# Patient Record
Sex: Female | Born: 2000 | Hispanic: Yes | Marital: Single | Smoking: Never smoker
Health system: Southern US, Community
[De-identification: ages and names within clinical notes are randomized; demographics above are authoritative.]

## PROBLEM LIST (undated history)

## (undated) ENCOUNTER — Emergency Department (HOSPITAL_COMMUNITY): Payer: Self-pay | Source: Home / Self Care

## (undated) DIAGNOSIS — Z789 Other specified health status: Secondary | ICD-10-CM

## (undated) HISTORY — DX: Other specified health status: Z78.9

## (undated) HISTORY — PX: WISDOM TOOTH EXTRACTION: SHX21

---
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
## 2020-06-06 ENCOUNTER — Encounter (HOSPITAL_COMMUNITY): Payer: Self-pay | Admitting: Emergency Medicine

## 2020-06-06 ENCOUNTER — Emergency Department (HOSPITAL_COMMUNITY): Payer: Medicaid Other

## 2020-06-06 ENCOUNTER — Emergency Department (HOSPITAL_COMMUNITY)
Admission: EM | Admit: 2020-06-06 | Discharge: 2020-06-06 | Disposition: A | Discharge status: Home / Self Care | Payer: Medicaid Other | Attending: Emergency Medicine | Admitting: Emergency Medicine

## 2020-06-06 ENCOUNTER — Other Ambulatory Visit: Payer: Self-pay

## 2020-06-06 DIAGNOSIS — Z5331 Laparoscopic surgical procedure converted to open procedure: Secondary | ICD-10-CM | POA: Insufficient documentation

## 2020-06-06 DIAGNOSIS — R945 Abnormal results of liver function studies: Secondary | ICD-10-CM | POA: Diagnosis not present

## 2020-06-06 DIAGNOSIS — R109 Unspecified abdominal pain: Secondary | ICD-10-CM | POA: Diagnosis present

## 2020-06-06 DIAGNOSIS — Z8719 Personal history of other diseases of the digestive system: Secondary | ICD-10-CM | POA: Diagnosis not present

## 2020-06-06 DIAGNOSIS — K8050 Calculus of bile duct without cholangitis or cholecystitis without obstruction: Secondary | ICD-10-CM

## 2020-06-06 DIAGNOSIS — R112 Nausea with vomiting, unspecified: Secondary | ICD-10-CM | POA: Diagnosis not present

## 2020-06-06 DIAGNOSIS — R7989 Other specified abnormal findings of blood chemistry: Secondary | ICD-10-CM

## 2020-06-06 LAB — COMPREHENSIVE METABOLIC PANEL
ALT: 131 U/L — ABNORMAL HIGH (ref 0–44)
AST: 203 U/L — ABNORMAL HIGH (ref 15–41)
Albumin: 4.2 g/dL (ref 3.5–5.0)
Alkaline Phosphatase: 56 U/L (ref 38–126)
Anion gap: 9 (ref 5–15)
BUN: 9 mg/dL (ref 6–20)
CO2: 24 mmol/L (ref 22–32)
Calcium: 9.4 mg/dL (ref 8.9–10.3)
Chloride: 104 mmol/L (ref 98–111)
Creatinine, Ser: 0.66 mg/dL (ref 0.44–1.00)
GFR, Estimated: >60 mL/min (ref >60)
Glucose, Bld: 132 mg/dL — ABNORMAL HIGH (ref 70–99)
Potassium: 3.5 mmol/L (ref 3.5–5.1)
Sodium: 137 mmol/L (ref 135–145)
Total Bilirubin: 1 mg/dL (ref 0.3–1.2)
Total Protein: 7.6 g/dL (ref 6.5–8.1)

## 2020-06-06 LAB — URINALYSIS, ROUTINE W REFLEX MICROSCOPIC
Bilirubin Urine: NEGATIVE
Glucose, UA: NEGATIVE mg/dL
Hgb urine dipstick: NEGATIVE
Ketones, ur: NEGATIVE mg/dL
Nitrite: NEGATIVE
Protein, ur: 30 mg/dL — AB
Specific Gravity, Urine: 1.028 (ref 1.005–1.030)
pH: 6 (ref 5.0–8.0)

## 2020-06-06 LAB — CBC
HCT: 39.6 % (ref 36.0–46.0)
Hemoglobin: 13 g/dL (ref 12.0–15.0)
MCH: 30.4 pg (ref 26.0–34.0)
MCHC: 32.8 g/dL (ref 30.0–36.0)
MCV: 92.5 fL (ref 80.0–100.0)
Platelets: 276 10*3/uL (ref 150–400)
RBC: 4.28 MIL/uL (ref 3.87–5.11)
RDW: 11.8 % (ref 11.5–15.5)
WBC: 14.8 10*3/uL — ABNORMAL HIGH (ref 4.0–10.5)
nRBC: 0 % (ref 0.0–0.2)

## 2020-06-06 LAB — LIPASE, BLOOD: Lipase: 37 U/L (ref 11–51)

## 2020-06-06 LAB — I-STAT BETA HCG BLOOD, ED (MC, WL, AP ONLY): I-stat hCG, quantitative: <5 m[IU]/mL (ref <5)

## 2020-06-06 MED ORDER — FENTANYL CITRATE (PF) 100 MCG/2ML IJ SOLN
50.0000 ug | Freq: Once | INTRAMUSCULAR | Status: DC
  Filled 2020-06-06: qty 2

## 2020-06-06 MED ORDER — HYDROCODONE-ACETAMINOPHEN 5-325 MG PO TABS: 2.0000 | ORAL_TABLET | ORAL | 0 refills | Status: DC | PRN

## 2020-06-06 MED ORDER — ONDANSETRON 4 MG PO TBDP: 4.0000 mg | ORAL_TABLET | Freq: Three times a day (TID) | ORAL | 0 refills | Status: DC | PRN

## 2020-06-06 NOTE — ED Provider Notes (Signed)
Physical Exam  BP 108/74 (BP Location: Right Arm)   Pulse 66   Temp 98.6 F (37 C) (Oral)   Resp 15   SpO2 100%   Physical Exam Vitals and nursing note reviewed.  Constitutional:      General: She is not in acute distress.    Appearance: She is well-developed and well-nourished. She is not diaphoretic.  HENT:     Head: Normocephalic and atraumatic.  Eyes:     General: No scleral icterus.    Extraocular Movements: EOM normal.     Conjunctiva/sclera: Conjunctivae normal.  Pulmonary:     Effort: Pulmonary effort is normal. No respiratory distress.  Abdominal:     Tenderness: There is no abdominal tenderness.     Comments: Soft, nontender nondistended.  Musculoskeletal:     Cervical back: Normal range of motion.  Skin:    Findings: No rash.  Neurological:     Mental Status: She is alert.  Psychiatric:        Mood and Affect: Mood and affect normal.     ED Course/Procedures     Procedures  MDM   Care of patient assumed from PA Caccavale at 3:30 PM.  Agree with history, physical exam and plan.  See their note for further details.  Briefly, 20 y.o. female with PMH/PSH as below who presents with right upper quadrant pain. Reports symptoms began at 2 AM this morning. Reports history of similar symptoms in the past. Lab work significant for elevation in LFTs to the 100s.  Past Medical History:  Diagnosis Date  . Medical history non-contributory    Past Surgical History:  Procedure Laterality Date  . LAPAROSCOPIC APPENDECTOMY N/A 01/26/2019   Procedure: APPENDECTOMY LAPAROSCOPIC;  Surgeon: Harriette Bouillon, MD;  Location: [facility] OR;  Service: General;  Laterality: N/A;      Current Plan: Obtain RUQ U/S to rule out cholecystitis. Will dispo accordingly.   MDM/ED Course: 4:14 PM Ultrasound with cholelithiasis without evidence of obstruction or cholecystitis.  Patient with improvement in pain and on repeat abdominal exam, abdomen is nontender. She is able to keep down  fluids. Plan to treat symptomatically at home. Will consult general surgery for outpatient follow-up  4:32 PM Spoke to Dr. Andrey Campanile, on-call general surgeon.  Reviewed patient's lab work and imaging findings.  Feels that she can follow-up outpatient in the clinic with symptomatic treatment at home.  Consults: General surgery- Dr. Andrey Campanile   Significant labs/images: US Abdomen Limited RUQ (LIVER/GB)  Result Date: 06/06/2020 CLINICAL DATA:  Elevated liver function tests, abdominal pain since 2 a.m. EXAM: ULTRASOUND ABDOMEN LIMITED RIGHT UPPER QUADRANT COMPARISON:  MRI abdomen 01/26/2019 FINDINGS: Gallbladder: Calcified gallstones within the gallbladder lumen measuring up to 0.5 cm. No pericholecystic fluid or gallbladder wall thickening visualized. No sonographic Murphy sign noted by sonographer. Common bile duct: Diameter: 3 mm Liver: No focal lesion identified. Within normal limits in parenchymal echogenicity. Portal vein is patent on color Doppler imaging with normal direction of blood flow towards the liver. Other: Visualized portions of the right kidney demonstrates no hydronephrosis. IMPRESSION: Cholelithiasis with no acute cholecystitis or choledocholithiasis. Electronically Signed   By: Tish Frederickson M.D.   On: 06/06/2020 15:29    I personally reviewed and interpreted all labs.  The plan for this patient was discussed with Dr. Hyacinth Meeker, who voiced agreement and who oversaw evaluation and treatment of this patient.   Patient is hemodynamically stable, in NAD, and able to ambulate in the ED. Evaluation does not show pathology  that would require ongoing emergent intervention or inpatient treatment. I explained the diagnosis to the patient. Pain has been managed and has no complaints prior to discharge. Patient is comfortable with above plan and is stable for discharge at this time. All questions were answered prior to disposition. Strict return precautions for returning to the ED were discussed.  Encouraged follow up with PCP.   Prior to providing a prescription for a controlled substance, I independently reviewed the patient's recent prescription history on the [place] Controlled Substance Reporting System. The patient had no recent or regular prescriptions and was deemed appropriate for a brief, less than 3 day prescription of narcotic for acute analgesia.  An After Visit Summary was printed and given to the patient.   Portions of this note were generated with Scientist, clinical (histocompatibility and immunogenetics). Dictation errors may occur despite best attempts at proofreading.        Dietrich Pates, PA-C 06/06/20 1634    Eber Hong, MD 06/07/20 [phone]

## 2020-06-06 NOTE — Discharge Instructions (Signed)
Take the medications to help with your symptoms. Follow up at the surgery center listed below. Return to the ER if you start to experience worsening pain, fever, shortness of breath or uncontrollable vomiting.

## 2020-06-06 NOTE — ED Triage Notes (Signed)
Pt reports upper abd pain since 2am with nausea and vomiting.  States she was seen in [facility] for same approx 1 year ago.  Denies diarrhea.

## 2020-06-06 NOTE — ED Notes (Signed)
Patient transported to Ultrasound 

## 2020-06-06 NOTE — ED Provider Notes (Signed)
[facility] EMERGENCY DEPARTMENT Provider Note   CSN: [id number] Arrival date & time: 06/06/20  0354     History Chief Complaint  Patient presents with  . Abdominal Pain    Tracy Hines is a 20 y.o. female presented for evaluation nausea, vomiting, abd pain.   Patient states around 2:00 this morning she developed nausea, vomiting, abdominal pain.  She had 2 episodes of emesis.  She is not currently feeling nauseous.  She continues to have abdominal pain.  She took Tylenol and ibuprofen this morning around 3 AM without significant improvement of symptoms.  She has not taken anything else.  She reports history of similar, although this is more severe.  She had this evaluated last year, but had no imaging or ultrasounds done.  She denies fevers, chills, chest pain, shortness of breath, urinary symptoms, abnormal bowel movements.  She denies sick contacts.  She has no medical problems, takes no medications daily.  She denies tobacco, alcohol, or drug use.  HPI     Past Medical History:  Diagnosis Date  . Medical history non-contributory     Patient Active Problem List   Diagnosis Date Noted  . Appendicitis 01/26/2019    Past Surgical History:  Procedure Laterality Date  . LAPAROSCOPIC APPENDECTOMY N/A 01/26/2019   Procedure: APPENDECTOMY LAPAROSCOPIC;  Surgeon: Harriette Bouillon, MD;  Location: [facility] OR;  Service: General;  Laterality: N/A;     OB History    Gravida  2   Para      Term      Preterm      AB  1   Living        SAB      IAB      Ectopic      Multiple      Live Births              No family history on file.  Social History   Tobacco Use  . Smoking status: Never Smoker  . Smokeless tobacco: Never Used  Vaping Use  . Vaping Use: Former  Substance Use Topics  . Alcohol use: Not Currently  . Drug use: Not Currently    Types: Marijuana    Comment: 2-3 years ago    Home Medications Prior to Admission medications    Medication Sig Start Date End Date Taking? Authorizing Provider  Blood Pressure Monitoring (BLOOD PRESSURE MONITOR AUTOMAT) DEVI 1 Device by Does not apply route daily. 03/11/19   Reva Bores, MD  famotidine (PEPCID) 20 MG tablet Take 1 tablet (20 mg total) by mouth 2 (two) times daily. Patient not taking: Reported on 09/12/2019 03/11/19   Emi Holes, PA-C  ferrous sulfate (FERROUSUL) 325 (65 FE) MG tablet Take 1 tablet (325 mg total) by mouth 2 (two) times daily with a meal. Patient not taking: Reported on 09/12/2019 07/05/19   Raelyn Mora, CNM  Misc. Devices (GOJJI WEIGHT SCALE) MISC 1 Device by Does not apply route daily as needed. To weight self daily as needed at home. ICD-10 code: O09.90 06/21/19   Reva Bores, MD  Prenatal Vit-Fe Phos-FA-Omega (VITAFOL GUMMIES) 3.33-0.333-34.8 MG CHEW Chew 3 each by mouth daily. Patient not taking: Reported on 10/31/2019 03/11/19   Raelyn Mora, CNM    Allergies    Patient has no known allergies.  Review of Systems   Review of Systems  Gastrointestinal: Positive for abdominal pain, nausea (resolved) and vomiting (resolved).  All other systems reviewed and are negative.  Physical Exam Updated Vital Signs BP 108/74 (BP Location: Right Arm)   Pulse 66   Temp 98.6 F (37 C) (Oral)   Resp 15   SpO2 100%   Physical Exam Vitals and nursing note reviewed.  Constitutional:      General: She is not in acute distress.    Appearance: She is well-developed and well-nourished.     Comments: Resting in the bed in NAD  HENT:     Head: Normocephalic and atraumatic.  Eyes:     Extraocular Movements: EOM normal.     Conjunctiva/sclera: Conjunctivae normal.     Pupils: Pupils are equal, round, and reactive to light.  Cardiovascular:     Rate and Rhythm: Normal rate and regular rhythm.     Pulses: Normal pulses and intact distal pulses.  Pulmonary:     Effort: Pulmonary effort is normal. No respiratory distress.     Breath sounds:  Normal breath sounds. No wheezing.  Abdominal:     General: There is no distension.     Palpations: Abdomen is soft. There is no mass.     Tenderness: There is abdominal tenderness. There is no guarding or rebound.     Comments: Mild ttp of upper abd. no rigidity, guarding, distention.  Negative rebound.  Negative Murphy's.  Musculoskeletal:        General: Normal range of motion.     Cervical back: Normal range of motion and neck supple.  Skin:    General: Skin is warm and dry.     Capillary Refill: Capillary refill takes less than 2 seconds.  Neurological:     Mental Status: She is alert and oriented to person, place, and time.  Psychiatric:        Mood and Affect: Mood and affect normal.     ED Results / Procedures / Treatments   Labs (all labs ordered are listed, but only abnormal results are displayed) Labs Reviewed  COMPREHENSIVE METABOLIC PANEL - Abnormal; Notable for the following components:      Result Value   Glucose, Bld 132 (*)    AST 203 (*)    ALT 131 (*)    All other components within normal limits  CBC - Abnormal; Notable for the following components:   WBC 14.8 (*)    All other components within normal limits  URINALYSIS, ROUTINE W REFLEX MICROSCOPIC - Abnormal; Notable for the following components:   Color, Urine AMBER (*)    APPearance HAZY (*)    Protein, ur 30 (*)    Leukocytes,Ua MODERATE (*)    Bacteria, UA RARE (*)    All other components within normal limits  LIPASE, BLOOD  I-STAT BETA HCG BLOOD, ED ([facility], [facility], [facility] ONLY)    EKG None  Radiology No results found.  Procedures Procedures (including critical care time)  Medications Ordered in ED Medications  fentaNYL (SUBLIMAZE) injection 50 mcg (has no administration in time range)    ED Course  I have reviewed the triage vital signs and the nursing notes.  Pertinent labs & imaging results that were available during my care of the patient were reviewed by me and considered in my medical  decision making (see chart for details).    MDM Rules/Calculators/A&P                          Patient presented for evaluation nausea, vomiting, upper abdominal pain.  On exam, patient peers nontoxic.  Nausea and  vomiting has since resolved.  She continues to have some abdominal pain.  Considering location and intermittent nature of patient's pain, fever biliary cause.  Labs obtained in triage interpreted by me, shows elevated LFTs, once again pointing to a potential early or a cause.  Also consider viral illness.  Will treat symptomatically and obtain ultrasound.  As patient is well-appearing with a negative Murphy's and no fever, if imaging is reassuring she can likely be discharged.  Urine shows some signs of infection, however as patient is asymptomatic, I do not feel she needs treatment for asymptomatic bacteriuria.  Patient signed out to Malachi Carl, PA-C for f/u on [place] and reassessment of sxs.   Final Clinical Impression(s) / ED Diagnoses Final diagnoses:  Elevated LFTs    Rx / DC Orders ED Discharge Orders    None       Alveria Apley, PA-C 06/06/20 1513    Gwyneth Sprout, MD 06/06/20 1528

## 2020-06-06 NOTE — ED Notes (Signed)
Pt refused pain medication, denies any pain at this time. Pt states, "I usually have pain at night"

## 2020-08-17 ENCOUNTER — Ambulatory Visit: Payer: Self-pay | Admitting: Surgery

## 2020-08-17 NOTE — H&P (Signed)
Tracy Hines Appointment: 08/17/2020 3:00 PM Location: [facility] Patient #: [id number] DOB: 2000-09-29 Single / Language: [name] / Race: Refused to Report/Unreported Female  History of Present Illness Tracy Hines A. Tracy Lucchese MD; 08/17/2020 3:34 PM) Patient words: Patient presents for evaluation of gallstones. She's had about 10 attacks of right upper quadrant pain after eating. She seen back in January and emergency room and diagnosed with gallstones. She describes the pain is intermittent. Locations right upper quadrant of abdomen with radiation to her back. Pain is sharp in nature lasting minutes to hours made worse with eating especially fatty greasy food. Ultrasound showed gallstones with a 3 mm common duct. She did have elevation in her aspartate aminotransferase and ALT of time back in January but her bilirubin and alkaline phosphatase are within normal limits.           Elevated liver function tests, abdominal pain since 2 Tracym.  EXAM: ULTRASOUND ABDOMEN LIMITED RIGHT UPPER QUADRANT  COMPARISON: MRI abdomen 01/26/2019  FINDINGS: Gallbladder:  Calcified gallstones within the gallbladder lumen measuring up to 0.5 cm. No pericholecystic fluid or gallbladder wall thickening visualized. No sonographic Murphy sign noted by sonographer.  Common bile duct:  Diameter: 3 mm  Liver:  No focal lesion identified. Within normal limits in parenchymal echogenicity. Portal vein is patent on color Doppler imaging with normal direction of blood flow towards the liver.  Other: Visualized portions of the right kidney demonstrates no hydronephrosis.  IMPRESSION: Cholelithiasis with no acute cholecystitis or choledocholithiasis.   Electronically Signed By: Tish Frederickson M.D. On: 06/06/2020 15:29.  The patient is a 20 year old female.   Past Surgical History Tracy Hines, CMA; 08/17/2020 3:14 PM) Appendectomy Foot Surgery Left. Oral  Surgery  Diagnostic Studies History Tracy Hines, CMA; 08/17/2020 3:14 PM) Colonoscopy never Mammogram never Pap Smear never  Allergies (Tracy Hines, CMA; 08/17/2020 3:15 PM) No Known Drug Allergies [08/17/2020]: No Known Allergies [08/17/2020]: Allergies Reconciled  Medication History (Tracy Hines, CMA; 08/17/2020 3:16 PM) HYDROcodone-Acetaminophen (5-325MG  Tablet, Oral) Active. Ondansetron (4MG  Tablet Disint, Oral) Active. Medications Reconciled  Social History , CMA; 08/17/2020 3:14 PM) Alcohol use Occasional alcohol use. Caffeine use Coffee, Tea. No drug use Tobacco use Never smoker.  Family History 08/19/2020, CMA; 08/17/2020 3:14 PM) First Degree Relatives No pertinent family history  Pregnancy / Birth History 08/19/2020, CMA; 08/17/2020 3:14 PM) Age at menarche 10 years. Gravida 2 Irregular periods Maternal age 41-20 Para 1  Other Problems 19-38, CMA; 08/17/2020 3:14 PM) No pertinent past medical history     Review of Systems 08/19/2020 Hines CMA; 08/17/2020 3:14 PM) General Present- Fatigue. Not Present- Appetite Loss, Chills, Fever, Night Sweats, Weight Gain and Weight Loss. Skin Not Present- Change in Wart/Mole, Dryness, Hives, Jaundice, New Lesions, Non-Healing Wounds, Rash and Ulcer. HEENT Not Present- Earache, Hearing Loss, Hoarseness, Nose Bleed, Oral Ulcers, Ringing in the Ears, Seasonal Allergies, Sinus Pain, Sore Throat, Visual Disturbances, Wears glasses/contact lenses and Yellow Eyes. Respiratory Not Present- Bloody sputum, Chronic Cough, Difficulty Breathing, Snoring and Wheezing. Breast Not Present- Breast Mass, Breast Pain, Nipple Discharge and Skin Changes. Cardiovascular Not Present- Chest Pain, Difficulty Breathing Lying Down, Leg Cramps, Palpitations, Rapid Heart Rate, Shortness of Breath and Swelling of  Extremities. Gastrointestinal Present- Nausea and Vomiting. Not Present- Abdominal Pain, Bloating, Bloody Stool, Change in Bowel Habits, Chronic diarrhea, Constipation, Difficulty Swallowing, Excessive gas, Gets full quickly at meals, Hemorrhoids, Indigestion and Rectal Pain. Female Genitourinary Not Present- Frequency, Nocturia, Painful Urination, Pelvic Pain and Urgency. Musculoskeletal  Present- Back Pain. Not Present- Joint Pain, Joint Stiffness, Muscle Pain, Muscle Weakness and Swelling of Extremities. Neurological Not Present- Decreased Memory, Fainting, Headaches, Numbness, Seizures, Tingling, Tremor, Trouble walking and Weakness. Psychiatric Not Present- Anxiety, Bipolar, Change in Sleep Pattern, Depression, Fearful and Frequent crying. Endocrine Not Present- Cold Intolerance, Excessive Hunger, Hair Changes, Heat Intolerance, Hot flashes and New Diabetes. Hematology Not Present- Blood Thinners, Easy Bruising, Excessive bleeding, Gland problems, HIV and Persistent Infections.  Vitals (Tracy Hines CMA; 08/17/2020 3:17 PM) 08/17/2020 3:16 PM Weight: 138.13 lb (67th percentile) Height: 61in (10th percentile) Body Surface Area: 1.61 m Body Mass Index: 26.1 kg/m  (84th percentile)  Temp.: 97.31F  Pulse: 96 (Regular)  P.OX: 99% (Room air) BP: 114/68(Sitting, Left Arm, Standard)  Percentiles calculated using CDC data for children 2-20 years.      Physical Exam (Tracy Hines [name] MD; 08/17/2020 3:34 PM)  General Mental Status-Alert. General Appearance-Consistent with stated age. Hydration-Well hydrated. Voice-Normal.  Head and Neck Head-normocephalic, atraumatic with no lesions or palpable masses.  Eye Eyeball - Bilateral-Extraocular movements intact. Sclera/Conjunctiva - Bilateral-No scleral icterus.  Chest and Lung Exam Chest and lung exam reveals -quiet, even and easy respiratory effort with no use of accessory muscles and on  auscultation, normal breath sounds, no adventitious sounds and normal vocal resonance. Inspection Chest Wall - Normal. Back - normal.  Cardiovascular Cardiovascular examination reveals -on palpation PMI is normal in location and amplitude, no palpable S3 or S4. Normal cardiac borders., normal heart sounds, regular rate and rhythm with no murmurs, carotid auscultation reveals no bruits and normal pedal pulses bilaterally.  Abdomen Inspection Inspection of the abdomen reveals - No Hernias. Skin - Scar - no surgical scars. Palpation/Percussion Palpation and Percussion of the abdomen reveal - Soft, Non Tender, No Rebound tenderness, No Rigidity (guarding) and No hepatosplenomegaly. Auscultation Auscultation of the abdomen reveals - Bowel sounds normal.  Neurologic Neurologic evaluation reveals -alert and oriented x 3 with no impairment of recent or remote memory. Mental Status-Normal.  Musculoskeletal Normal Exam - Left-Upper Extremity Strength Normal and Lower Extremity Strength Normal. Normal Exam - Right-Upper Extremity Strength Normal, Lower Extremity Weakness.    Assessment & Plan (Philemon Riedesel A. Jazma Pickel MD; 08/17/2020 3:31 PM)  SYMPTOMATIC CHOLELITHIASIS (K80.20)  Current Plans You are being scheduled for surgery- Our schedulers will call you.  You should hear from our office's scheduling department within 5 working days about the location, date, and time of surgery. We try to make accommodations for patient's preferences in scheduling surgery, but sometimes the OR schedule or the surgeon's schedule prevents [place] from making those accommodations.  If you have not heard from our office [phone]) in 5 working days, call the office and ask for your surgeon's nurse.  If you have other questions about your diagnosis, plan, or surgery, call the office and ask for your surgeon's nurse.  Pt Education - Pamphlet Given - Laparoscopic Gallbladder Surgery: discussed with patient  and provided information. Pt Education - Laparoscopic Cholecystectomy: gallbladder Pt Education - [facility]CS Laparosopic Post Op HCI (Gross) The anatomy & physiology of hepatobiliary & pancreatic function was discussed. The pathophysiology of gallbladder dysfunction was discussed. Natural history risks without surgery was discussed. I feel the risks of no intervention will lead to serious problems that outweigh the operative risks; therefore, I recommended cholecystectomy to remove the pathology. I explained laparoscopic techniques with possible need for an open approach. Probable cholangiogram to evaluate the bilary tract was explained as well.  Risks such as bleeding, infection, abscess, leak,  injury to other organs, need for further treatment, heart attack, death, and other risks were discussed. I noted a good likelihood this will help address the problem. Possibility that this will not correct all abdominal symptoms was explained. Goals of post-operative recovery were discussed as well. We will work to minimize complications. An educational handout further explaining the pathology and treatment options was given as well. Questions were answered. The patient expresses understanding & wishes to proceed with surgery.

## 2020-09-18 ENCOUNTER — Other Ambulatory Visit (HOSPITAL_COMMUNITY)
Admission: RE | Admit: 2020-09-18 | Discharge: 2020-09-18 | Disposition: A | Discharge status: Home / Self Care | Payer: Medicaid Other | Source: Ambulatory Visit | Attending: Surgery | Admitting: Surgery

## 2020-09-18 DIAGNOSIS — Z20822 Contact with and (suspected) exposure to covid-19: Secondary | ICD-10-CM | POA: Insufficient documentation

## 2020-09-18 DIAGNOSIS — Z01812 Encounter for preprocedural laboratory examination: Secondary | ICD-10-CM | POA: Diagnosis present

## 2020-09-18 LAB — SARS CORONAVIRUS 2 (TAT 6-24 HRS): SARS Coronavirus 2: NEGATIVE

## 2020-09-21 ENCOUNTER — Encounter (HOSPITAL_COMMUNITY): Payer: Self-pay | Admitting: Surgery

## 2020-09-21 ENCOUNTER — Other Ambulatory Visit: Payer: Self-pay

## 2020-09-21 NOTE — Progress Notes (Signed)
Patient denies shortness of breath, fever, cough or chest pain.  PCP - None Cardiologist - n/a  Chest x-ray - n/a EKG - n/a Stress Test - n/a ECHO - n/a Cardiac Cath - n/a  STOP now taking any Aspirin (unless otherwise instructed by your surgeon), Aleve, Naproxen, Ibuprofen, Motrin, Advil, Goody's, BC's, all herbal medications, fish oil, and all vitamins.   Coronavirus Screening Covid test on 09/18/20 was negative.  Patient verbalized understanding of instructions that were given via phone.

## 2020-09-21 NOTE — Anesthesia Preprocedure Evaluation (Addendum)
Anesthesia Evaluation  Patient identified by MRN, date of birth, ID band Patient awake    Reviewed: Allergy & Precautions, NPO status , Patient's Chart, lab work & pertinent test results  Airway Mallampati: II  TM Distance: >3 FB Neck ROM: Full    Dental no notable dental hx.    Pulmonary neg pulmonary ROS, Patient abstained from smoking.,    Pulmonary exam normal breath sounds clear to auscultation       Cardiovascular negative cardio ROS Normal cardiovascular exam Rhythm:Regular Rate:Normal     Neuro/Psych negative neurological ROS  negative psych ROS   GI/Hepatic Neg liver ROS, Gallstones   Endo/Other  negative endocrine ROS  Renal/GU negative Renal ROS  negative genitourinary   Musculoskeletal negative musculoskeletal ROS (+)   Abdominal   Peds negative pediatric ROS (+)  Hematology negative hematology ROS (+)   Anesthesia Other Findings   Reproductive/Obstetrics negative OB ROS                            Anesthesia Physical Anesthesia Plan  ASA: I  Anesthesia Plan: General   Post-op Pain Management:    Induction: Intravenous  PONV Risk Score and Plan: 3 and Scopolamine patch - Pre-op, Ondansetron, Dexamethasone and Treatment may vary due to age or medical condition  Airway Management Planned: Oral ETT  Additional Equipment: None  Intra-op Plan:   Post-operative Plan: Extubation in OR  Informed Consent: I have reviewed the patients History and Physical, chart, labs and discussed the procedure including the risks, benefits and alternatives for the proposed anesthesia with the patient or authorized representative who has indicated his/her understanding and acceptance.     Dental advisory given  Plan Discussed with: CRNA, Anesthesiologist and Surgeon  Anesthesia Plan Comments:        Anesthesia Quick Evaluation

## 2020-09-22 ENCOUNTER — Encounter (HOSPITAL_COMMUNITY)
Admission: RE | Disposition: A | Discharge status: Home / Self Care | Payer: Self-pay | Source: Home / Self Care | Attending: Surgery

## 2020-09-22 ENCOUNTER — Ambulatory Visit (HOSPITAL_COMMUNITY): Payer: Medicaid Other

## 2020-09-22 ENCOUNTER — Encounter (HOSPITAL_COMMUNITY): Payer: Self-pay | Admitting: Surgery

## 2020-09-22 ENCOUNTER — Ambulatory Visit (HOSPITAL_COMMUNITY): Payer: Medicaid Other | Admitting: Anesthesiology

## 2020-09-22 ENCOUNTER — Other Ambulatory Visit: Payer: Self-pay

## 2020-09-22 ENCOUNTER — Ambulatory Visit (HOSPITAL_COMMUNITY)
Admission: RE | Admit: 2020-09-22 | Discharge: 2020-09-22 | Disposition: A | Discharge status: Home / Self Care | Payer: Medicaid Other | Attending: Surgery | Admitting: Surgery

## 2020-09-22 DIAGNOSIS — K8010 Calculus of gallbladder with chronic cholecystitis without obstruction: Secondary | ICD-10-CM | POA: Diagnosis not present

## 2020-09-22 DIAGNOSIS — Z79899 Other long term (current) drug therapy: Secondary | ICD-10-CM | POA: Diagnosis not present

## 2020-09-22 DIAGNOSIS — Z419 Encounter for procedure for purposes other than remedying health state, unspecified: Secondary | ICD-10-CM

## 2020-09-22 DIAGNOSIS — Z9049 Acquired absence of other specified parts of digestive tract: Secondary | ICD-10-CM | POA: Insufficient documentation

## 2020-09-22 DIAGNOSIS — K8020 Calculus of gallbladder without cholecystitis without obstruction: Secondary | ICD-10-CM | POA: Diagnosis present

## 2020-09-22 HISTORY — PX: CHOLECYSTECTOMY: SHX55

## 2020-09-22 LAB — COMPREHENSIVE METABOLIC PANEL
ALT: 20 U/L (ref 0–44)
AST: 32 U/L (ref 15–41)
Albumin: 3.5 g/dL (ref 3.5–5.0)
Alkaline Phosphatase: 46 U/L (ref 38–126)
Anion gap: 7 (ref 5–15)
BUN: 8 mg/dL (ref 6–20)
CO2: 25 mmol/L (ref 22–32)
Calcium: 8.9 mg/dL (ref 8.9–10.3)
Chloride: 106 mmol/L (ref 98–111)
Creatinine, Ser: 0.69 mg/dL (ref 0.44–1.00)
GFR, Estimated: >60 mL/min (ref >60)
Glucose, Bld: 97 mg/dL (ref 70–99)
Potassium: 3.4 mmol/L — ABNORMAL LOW (ref 3.5–5.1)
Sodium: 138 mmol/L (ref 135–145)
Total Bilirubin: 0.4 mg/dL (ref 0.3–1.2)
Total Protein: 6.2 g/dL — ABNORMAL LOW (ref 6.5–8.1)

## 2020-09-22 LAB — CBC WITH DIFFERENTIAL/PLATELET
Abs Immature Granulocytes: 0.04 10*3/uL (ref 0.00–0.07)
Basophils Absolute: 0.1 10*3/uL (ref 0.0–0.1)
Basophils Relative: 1 %
Eosinophils Absolute: 0.1 10*3/uL (ref 0.0–0.5)
Eosinophils Relative: 2 %
HCT: 33.3 % — ABNORMAL LOW (ref 36.0–46.0)
Hemoglobin: 11.3 g/dL — ABNORMAL LOW (ref 12.0–15.0)
Immature Granulocytes: 1 %
Lymphocytes Relative: 35 %
Lymphs Abs: 2.2 10*3/uL (ref 0.7–4.0)
MCH: 31 pg (ref 26.0–34.0)
MCHC: 33.9 g/dL (ref 30.0–36.0)
MCV: 91.5 fL (ref 80.0–100.0)
Monocytes Absolute: 0.7 10*3/uL (ref 0.1–1.0)
Monocytes Relative: 10 %
Neutro Abs: 3.2 10*3/uL (ref 1.7–7.7)
Neutrophils Relative %: 51 %
Platelets: 199 10*3/uL (ref 150–400)
RBC: 3.64 MIL/uL — ABNORMAL LOW (ref 3.87–5.11)
RDW: 13.3 % (ref 11.5–15.5)
WBC: 6.3 10*3/uL (ref 4.0–10.5)
nRBC: 0 % (ref 0.0–0.2)

## 2020-09-22 LAB — POCT PREGNANCY, URINE: Preg Test, Ur: NEGATIVE

## 2020-09-22 SURGERY — LAPAROSCOPIC CHOLECYSTECTOMY WITH INTRAOPERATIVE CHOLANGIOGRAM
Anesthesia: General | Site: Abdomen

## 2020-09-22 MED ORDER — DROPERIDOL 2.5 MG/ML IJ SOLN: 0.6250 mg | Freq: Once | INTRAMUSCULAR | Status: DC | PRN

## 2020-09-22 MED ORDER — ORAL CARE MOUTH RINSE: 15.0000 mL | Freq: Once | OROMUCOSAL | Status: AC

## 2020-09-22 MED ORDER — PROPOFOL 10 MG/ML IV BOLUS
INTRAVENOUS | Status: AC
  Filled 2020-09-22: qty 40

## 2020-09-22 MED ORDER — SCOPOLAMINE 1 MG/3DAYS TD PT72
1.0000 | MEDICATED_PATCH | TRANSDERMAL | Status: DC
  Administered 2020-09-22: 1.5 mg via TRANSDERMAL
  Filled 2020-09-22: qty 1

## 2020-09-22 MED ORDER — ONDANSETRON HCL 4 MG/2ML IJ SOLN
INTRAMUSCULAR | Status: AC
  Filled 2020-09-22: qty 2

## 2020-09-22 MED ORDER — BUPIVACAINE-EPINEPHRINE 0.25% -1:200000 IJ SOLN
INTRAMUSCULAR | Status: DC | PRN
  Administered 2020-09-22: 10 mL

## 2020-09-22 MED ORDER — ACETAMINOPHEN 500 MG PO TABS
1000.0000 mg | ORAL_TABLET | ORAL | Status: AC
  Administered 2020-09-22: 1000 mg via ORAL
  Filled 2020-09-22: qty 2

## 2020-09-22 MED ORDER — CEFAZOLIN SODIUM-DEXTROSE 2-4 GM/100ML-% IV SOLN
2.0000 g | INTRAVENOUS | Status: AC
  Administered 2020-09-22: 2 g via INTRAVENOUS
  Filled 2020-09-22: qty 100

## 2020-09-22 MED ORDER — OXYCODONE HCL 5 MG/5ML PO SOLN: 5.0000 mg | Freq: Once | ORAL | Status: AC | PRN

## 2020-09-22 MED ORDER — DEXAMETHASONE SODIUM PHOSPHATE 10 MG/ML IJ SOLN
INTRAMUSCULAR | Status: DC | PRN
  Administered 2020-09-22: 5 mg via INTRAVENOUS

## 2020-09-22 MED ORDER — CHLORHEXIDINE GLUCONATE 0.12 % MT SOLN
15.0000 mL | Freq: Once | OROMUCOSAL | Status: AC
  Administered 2020-09-22: 15 mL via OROMUCOSAL
  Filled 2020-09-22: qty 15

## 2020-09-22 MED ORDER — FENTANYL CITRATE (PF) 100 MCG/2ML IJ SOLN
25.0000 ug | INTRAMUSCULAR | Status: DC | PRN
  Administered 2020-09-22: 50 ug via INTRAVENOUS

## 2020-09-22 MED ORDER — ONDANSETRON HCL 4 MG PO TABS: 4.0000 mg | ORAL_TABLET | Freq: Every day | ORAL | 1 refills | Status: AC | PRN

## 2020-09-22 MED ORDER — MIDAZOLAM HCL 2 MG/2ML IJ SOLN
INTRAMUSCULAR | Status: DC | PRN
  Administered 2020-09-22: 2 mg via INTRAVENOUS

## 2020-09-22 MED ORDER — HEMOSTATIC AGENTS (NO CHARGE) OPTIME
TOPICAL | Status: DC | PRN
  Administered 2020-09-22: 1 via TOPICAL

## 2020-09-22 MED ORDER — FENTANYL CITRATE (PF) 250 MCG/5ML IJ SOLN
INTRAMUSCULAR | Status: AC
  Filled 2020-09-22: qty 5

## 2020-09-22 MED ORDER — IBUPROFEN 800 MG PO TABS: 800.0000 mg | ORAL_TABLET | Freq: Three times a day (TID) | ORAL | 0 refills | Status: AC | PRN

## 2020-09-22 MED ORDER — OXYCODONE HCL 5 MG PO TABS
5.0000 mg | ORAL_TABLET | Freq: Once | ORAL | Status: AC | PRN
  Administered 2020-09-22: 5 mg via ORAL

## 2020-09-22 MED ORDER — KETOROLAC TROMETHAMINE 30 MG/ML IJ SOLN
INTRAMUSCULAR | Status: AC
  Filled 2020-09-22: qty 1

## 2020-09-22 MED ORDER — ROCURONIUM BROMIDE 10 MG/ML (PF) SYRINGE
PREFILLED_SYRINGE | INTRAVENOUS | Status: AC
  Filled 2020-09-22: qty 10

## 2020-09-22 MED ORDER — LACTATED RINGERS IV SOLN: INTRAVENOUS | Status: DC

## 2020-09-22 MED ORDER — KETOROLAC TROMETHAMINE 30 MG/ML IJ SOLN
30.0000 mg | Freq: Once | INTRAMUSCULAR | Status: AC
  Administered 2020-09-22: 30 mg via INTRAVENOUS

## 2020-09-22 MED ORDER — FENTANYL CITRATE (PF) 100 MCG/2ML IJ SOLN
INTRAMUSCULAR | Status: AC
  Filled 2020-09-22: qty 2

## 2020-09-22 MED ORDER — PROMETHAZINE HCL 25 MG/ML IJ SOLN: 6.2500 mg | INTRAMUSCULAR | Status: DC | PRN

## 2020-09-22 MED ORDER — PROPOFOL 10 MG/ML IV BOLUS
INTRAVENOUS | Status: DC | PRN
  Administered 2020-09-22 (×2): 20 mg via INTRAVENOUS
  Administered 2020-09-22: 150 mg via INTRAVENOUS

## 2020-09-22 MED ORDER — PHENYLEPHRINE 40 MCG/ML (10ML) SYRINGE FOR IV PUSH (FOR BLOOD PRESSURE SUPPORT)
PREFILLED_SYRINGE | INTRAVENOUS | Status: DC | PRN
  Administered 2020-09-22: 80 ug via INTRAVENOUS

## 2020-09-22 MED ORDER — MIDAZOLAM HCL 2 MG/2ML IJ SOLN
INTRAMUSCULAR | Status: AC
  Filled 2020-09-22: qty 2

## 2020-09-22 MED ORDER — CELECOXIB 200 MG PO CAPS
200.0000 mg | ORAL_CAPSULE | ORAL | Status: AC
  Administered 2020-09-22: 200 mg via ORAL
  Filled 2020-09-22: qty 1

## 2020-09-22 MED ORDER — CHLORHEXIDINE GLUCONATE CLOTH 2 % EX PADS: 6.0000 | MEDICATED_PAD | Freq: Once | CUTANEOUS | Status: DC

## 2020-09-22 MED ORDER — LIDOCAINE 2% (20 MG/ML) 5 ML SYRINGE
INTRAMUSCULAR | Status: DC | PRN
  Administered 2020-09-22: 40 mg via INTRAVENOUS
  Administered 2020-09-22: 60 mg via INTRAVENOUS

## 2020-09-22 MED ORDER — SUGAMMADEX SODIUM 200 MG/2ML IV SOLN
INTRAVENOUS | Status: DC | PRN
  Administered 2020-09-22: 200 mg via INTRAVENOUS

## 2020-09-22 MED ORDER — CHLORHEXIDINE GLUCONATE CLOTH 2 % EX PADS
6.0000 | MEDICATED_PAD | Freq: Once | CUTANEOUS | Status: AC
  Administered 2020-09-22: 6 via TOPICAL

## 2020-09-22 MED ORDER — DEXMEDETOMIDINE (PRECEDEX) IN NS 20 MCG/5ML (4 MCG/ML) IV SYRINGE
PREFILLED_SYRINGE | INTRAVENOUS | Status: DC | PRN
  Administered 2020-09-22: 4 ug via INTRAVENOUS
  Administered 2020-09-22: 8 ug via INTRAVENOUS

## 2020-09-22 MED ORDER — ONDANSETRON HCL 4 MG/2ML IJ SOLN
INTRAMUSCULAR | Status: DC | PRN
  Administered 2020-09-22: 4 mg via INTRAVENOUS

## 2020-09-22 MED ORDER — BUPIVACAINE-EPINEPHRINE (PF) 0.25% -1:200000 IJ SOLN
INTRAMUSCULAR | Status: AC
  Filled 2020-09-22: qty 30

## 2020-09-22 MED ORDER — LIDOCAINE 2% (20 MG/ML) 5 ML SYRINGE
INTRAMUSCULAR | Status: AC
  Filled 2020-09-22: qty 5

## 2020-09-22 MED ORDER — SODIUM CHLORIDE 0.9 % IR SOLN
Status: DC | PRN
  Administered 2020-09-22: 1000 mL

## 2020-09-22 MED ORDER — OXYCODONE HCL 5 MG PO TABS: 5.0000 mg | ORAL_TABLET | Freq: Four times a day (QID) | ORAL | 0 refills | Status: DC | PRN

## 2020-09-22 MED ORDER — DEXAMETHASONE SODIUM PHOSPHATE 10 MG/ML IJ SOLN
INTRAMUSCULAR | Status: AC
  Filled 2020-09-22: qty 1

## 2020-09-22 MED ORDER — OXYCODONE HCL 5 MG PO TABS
ORAL_TABLET | ORAL | Status: AC
  Filled 2020-09-22: qty 1

## 2020-09-22 MED ORDER — 0.9 % SODIUM CHLORIDE (POUR BTL) OPTIME
TOPICAL | Status: DC | PRN
  Administered 2020-09-22: 1000 mL

## 2020-09-22 MED ORDER — ROCURONIUM BROMIDE 10 MG/ML (PF) SYRINGE
PREFILLED_SYRINGE | INTRAVENOUS | Status: DC | PRN
  Administered 2020-09-22: 60 mg via INTRAVENOUS

## 2020-09-22 MED ORDER — FENTANYL CITRATE (PF) 250 MCG/5ML IJ SOLN
INTRAMUSCULAR | Status: DC | PRN
  Administered 2020-09-22 (×2): 50 ug via INTRAVENOUS

## 2020-09-22 SURGICAL SUPPLY — 44 items
APPLICATOR ARISTA FLEXITIP XL (MISCELLANEOUS) ×2 IMPLANT
APPLIER CLIP ROT 10 11.4 M/L (STAPLE) ×2
CANISTER SUCT 3000ML PPV (MISCELLANEOUS) ×2 IMPLANT
CHLORAPREP W/TINT 26 (MISCELLANEOUS) ×2 IMPLANT
CLIP APPLIE ROT 10 11.4 M/L (STAPLE) ×1 IMPLANT
COVER MAYO STAND STRL (DRAPES) ×2 IMPLANT
COVER SURGICAL LIGHT HANDLE (MISCELLANEOUS) ×2 IMPLANT
COVER WAND RF STERILE (DRAPES) ×2 IMPLANT
DERMABOND ADVANCED (GAUZE/BANDAGES/DRESSINGS) ×1
DERMABOND ADVANCED .7 DNX12 (GAUZE/BANDAGES/DRESSINGS) ×1 IMPLANT
DRAPE C-ARM 42X120 X-RAY (DRAPES) ×2 IMPLANT
ELECT REM PT RETURN 9FT ADLT (ELECTROSURGICAL) ×2
ELECTRODE REM PT RTRN 9FT ADLT (ELECTROSURGICAL) ×1 IMPLANT
GLOVE BIO SURGEON STRL SZ8 (GLOVE) ×2 IMPLANT
GLOVE SRG 8 PF TXTR STRL LF DI (GLOVE) ×1 IMPLANT
GLOVE SURG UNDER POLY LF SZ8 (GLOVE) ×1
GOWN STRL REUS W/ TWL LRG LVL3 (GOWN DISPOSABLE) ×2 IMPLANT
GOWN STRL REUS W/ TWL XL LVL3 (GOWN DISPOSABLE) ×1 IMPLANT
GOWN STRL REUS W/TWL LRG LVL3 (GOWN DISPOSABLE) ×2
GOWN STRL REUS W/TWL XL LVL3 (GOWN DISPOSABLE) ×1
HEMOSTAT ARISTA ABSORB 3G PWDR (HEMOSTASIS) ×2 IMPLANT
KIT BASIN OR (CUSTOM PROCEDURE TRAY) ×2 IMPLANT
KIT TURNOVER KIT B (KITS) ×2 IMPLANT
NS IRRIG 1000ML POUR BTL (IV SOLUTION) ×2 IMPLANT
PAD ARMBOARD 7.5X6 YLW CONV (MISCELLANEOUS) ×2 IMPLANT
POUCH RETRIEVAL ECOSAC 10 (ENDOMECHANICALS) ×1 IMPLANT
POUCH RETRIEVAL ECOSAC 10MM (ENDOMECHANICALS) ×1
SCISSORS LAP 5X35 DISP (ENDOMECHANICALS) ×2 IMPLANT
SET IRRIG TUBING LAPAROSCOPIC (IRRIGATION / IRRIGATOR) ×2 IMPLANT
SET TUBE SMOKE EVAC HIGH FLOW (TUBING) ×2 IMPLANT
SLEEVE ENDOPATH XCEL 5M (ENDOMECHANICALS) ×2 IMPLANT
SOL ANTI FOG 6CC (MISCELLANEOUS) ×1 IMPLANT
SOLUTION ANTI FOG 6CC (MISCELLANEOUS) ×1
SPECIMEN JAR SMALL (MISCELLANEOUS) ×2 IMPLANT
SUT MNCRL AB 4-0 PS2 18 (SUTURE) ×2 IMPLANT
SUT VICRYL 0 UR6 27IN ABS (SUTURE) ×2 IMPLANT
TOWEL GREEN STERILE (TOWEL DISPOSABLE) ×2 IMPLANT
TOWEL GREEN STERILE FF (TOWEL DISPOSABLE) ×2 IMPLANT
TRAY LAPAROSCOPIC MC (CUSTOM PROCEDURE TRAY) ×2 IMPLANT
TROCAR XCEL BLUNT TIP 100MML (ENDOMECHANICALS) ×2 IMPLANT
TROCAR XCEL NON-BLD 11X100MML (ENDOMECHANICALS) ×2 IMPLANT
TROCAR XCEL NON-BLD 5MMX100MML (ENDOMECHANICALS) ×2 IMPLANT
WARMER LAPAROSCOPE (MISCELLANEOUS) ×2 IMPLANT
WATER STERILE IRR 1000ML POUR (IV SOLUTION) ×2 IMPLANT

## 2020-09-22 NOTE — Transfer of Care (Signed)
Immediate Anesthesia Transfer of Care Note  Patient: Tracy Hines  Procedure(s) Performed: LAPAROSCOPIC CHOLECYSTECTOMY (N/A Abdomen)  Patient Location: PACU  Anesthesia Type:General  Level of Consciousness: drowsy, patient cooperative and responds to stimulation  Airway & Oxygen Therapy: Patient Spontanous Breathing  Post-op Assessment: Report given to RN, Post -op Vital signs reviewed and stable and Patient moving all extremities  Post vital signs: Reviewed and stable  Last Vitals:  Vitals Value Taken Time  BP 93/70 09/22/20 0853  Temp    Pulse 80 09/22/20 0854  Resp 20 09/22/20 0854  SpO2 94 % 09/22/20 0854  Vitals shown include unvalidated device data.  Last Pain:  Vitals:   09/22/20 0620  TempSrc:   PainSc: 0-No pain      Patients Stated Pain Goal: 1 (09/22/20 4665)  Complications: No complications documented.

## 2020-09-22 NOTE — H&P (Signed)
Tracy Hines  Location: [facility] Patient #: [id number] DOB: May 12, 2001 Single / Language: [name] / Race: Refused to Report/Unreported Female  History of Present Illness Patient words: Patient presents for evaluation of gallstones. She's had about 10 attacks of right upper quadrant pain after eating. She seen back in January and emergency room and diagnosed with gallstones. She describes the pain is intermittent. Locations right upper quadrant of abdomen with radiation to her back. Pain is sharp in nature lasting minutes to hours made worse with eating especially fatty greasy food. Ultrasound showed gallstones with a 3 mm common duct. She did have elevation in her aspartate aminotransferase and ALT of time back in January but her bilirubin and alkaline phosphatase are within normal limits.           Elevated liver function tests, abdominal pain since 2 a.m.  EXAM: ULTRASOUND ABDOMEN LIMITED RIGHT UPPER QUADRANT  COMPARISON: MRI abdomen 01/26/2019  FINDINGS: Gallbladder:  Calcified gallstones within the gallbladder lumen measuring up to 0.5 cm. No pericholecystic fluid or gallbladder wall thickening visualized. No sonographic Murphy sign noted by sonographer.  Common bile duct:  Diameter: 3 mm  Liver:  No focal lesion identified. Within normal limits in parenchymal echogenicity. Portal vein is patent on color Doppler imaging with normal direction of blood flow towards the liver.  Other: Visualized portions of the right kidney demonstrates no hydronephrosis.  IMPRESSION: Cholelithiasis with no acute cholecystitis or choledocholithiasis.   Electronically Signed By: Tish Frederickson M.D. On: 06/06/2020 15:29.  The patient is a 20 year old female.   Past Surgical History  Appendectomy Foot Surgery Left. Oral Surgery  Diagnostic Studies HistoryColonoscopy never Mammogram never Pap Smear  never  Allergies No Known Drug Allergies : Allergies Reconciled  Medication History  HYDROcodone-Acetaminophen (5-325MG  Tablet, Oral) Active. Ondansetron (4MG  Tablet Disint, Oral) Active. Medications Reconciled  Social History Alcohol use Occasional alcohol use. Caffeine use Coffee, Tea. No drug use Tobacco use Never smoker.  Family History First Degree Relatives No pertinent family history  Pregnancy / Birth History Age at menarche 10 years. Gravida 2 Irregular periods Maternal age 46-20 Para 1  Other Problems ( No pertinent past medical history     Review of Systems  General Present- Fatigue. Not Present- Appetite Loss, Chills, Fever, Night Sweats, Weight Gain and Weight Loss. Skin Not Present- Change in Wart/Mole, Dryness, Hives, Jaundice, New Lesions, Non-Healing Wounds, Rash and Ulcer. HEENT Not Present- Earache, Hearing Loss, Hoarseness, Nose Bleed, Oral Ulcers, Ringing in the Ears, Seasonal Allergies, Sinus Pain, Sore Throat, Visual Disturbances, Wears glasses/contact lenses and Yellow Eyes. Respiratory Not Present- Bloody sputum, Chronic Cough, Difficulty Breathing, Snoring and Wheezing. Breast Not Present- Breast Mass, Breast Pain, Nipple Discharge and Skin Changes. Cardiovascular Not Present- Chest Pain, Difficulty Breathing Lying Down, Leg Cramps, Palpitations, Rapid Heart Rate, Shortness of Breath and Swelling of Extremities. Gastrointestinal Present- Nausea and Vomiting. Not Present- Abdominal Pain, Bloating, Bloody Stool, Change in Bowel Habits, Chronic diarrhea, Constipation, Difficulty Swallowing, Excessive gas, Gets full quickly at meals, Hemorrhoids, Indigestion and Rectal Pain. Female Genitourinary Not Present- Frequency, Nocturia, Painful Urination, Pelvic Pain and Urgency. Musculoskeletal Present- Back Pain. Not Present- Joint Pain, Joint Stiffness, Muscle Pain, Muscle Weakness and Swelling of Extremities. Neurological Not  Present- Decreased Memory, Fainting, Headaches, Numbness, Seizures, Tingling, Tremor, Trouble walking and Weakness. Psychiatric Not Present- Anxiety, Bipolar, Change in Sleep Pattern, Depression, Fearful and Frequent crying. Endocrine Not Present- Cold Intolerance, Excessive Hunger, Hair Changes, Heat Intolerance, Hot flashes and New Diabetes. Hematology Not Present-  Blood Thinners, Easy Bruising, Excessive bleeding, Gland problems, HIV and Persistent Infections.  Vitals   Weight: 138.13 lb (67th percentile) Height: 61in (10th percentile) Body Surface Area: 1.61 m Body Mass Index: 26.1 kg/m  (84th percentile)  Temp.: 97.32F  Pulse: 96 (Regular)  P.OX: 99% (Room air) BP: 114/68(Sitting, Left Arm, Standard)  Percentiles calculated using CDC data for children 2-20 years.      Physical Exam   General Mental Status-Alert. General Appearance-Consistent with stated age. Hydration-Well hydrated. Voice-Normal.  Head and Neck Head-normocephalic, atraumatic with no lesions or palpable masses.  Eye Eyeball - Bilateral-Extraocular movements intact. Sclera/Conjunctiva - Bilateral-No scleral icterus.  Chest and Lung Exam Chest and lung exam reveals -quiet, even and easy respiratory effort with no use of accessory muscles and on auscultation, normal breath sounds, no adventitious sounds and normal vocal resonance. Inspection Chest Wall - Normal. Back - normal.  Cardiovascular Cardiovascular examination reveals -on palpation PMI is normal in location and amplitude, no palpable S3 or S4. Normal cardiac borders., normal heart sounds, regular rate and rhythm with no murmurs, carotid auscultation reveals no bruits and normal pedal pulses bilaterally.  Abdomen Inspection Inspection of the abdomen reveals - No Hernias. Skin - Scar - no surgical scars. Palpation/Percussion Palpation and Percussion of the abdomen reveal - Soft, Non Tender, No  Rebound tenderness, No Rigidity (guarding) and No hepatosplenomegaly. Auscultation Auscultation of the abdomen reveals - Bowel sounds normal.  Neurologic Neurologic evaluation reveals -alert and oriented x 3 with no impairment of recent or remote memory. Mental Status-Normal.  Musculoskeletal Normal Exam - Left-Upper Extremity Strength Normal and Lower Extremity Strength Normal. Normal Exam - Right-Upper Extremity Strength Normal, Lower Extremity Weakness.    Assessment & Plan  SYMPTOMATIC CHOLELITHIASIS (K80.20)  Current Plans You are being scheduled for surgery- Our schedulers will call you.  You should hear from our office's scheduling department within 5 working days about the location, date, and time of surgery. We try to make accommodations for patient's preferences in scheduling surgery, but sometimes the OR schedule or the surgeon's schedule prevents [place] from making those accommodations.  If you have not heard from our office [phone]) in 5 working days, call the office and ask for your surgeon's nurse.  If you have other questions about your diagnosis, plan, or surgery, call the office and ask for your surgeon's nurse.  Pt Education - Pamphlet Given - Laparoscopic Gallbladder Surgery: discussed with patient and provided information. Pt Education - Laparoscopic Cholecystectomy: gallbladder Pt Education - [facility] Laparosopic Post Op HCI (Gross) The anatomy & physiology of hepatobiliary & pancreatic function was discussed. The pathophysiology of gallbladder dysfunction was discussed. Natural history risks without surgery was discussed. I feel the risks of no intervention will lead to serious problems that outweigh the operative risks; therefore, I recommended cholecystectomy to remove the pathology. I explained laparoscopic techniques with possible need for an open approach. Probable cholangiogram to evaluate the bilary tract was explained as  well.  Risks such as bleeding, infection, abscess, leak, injury to other organs, need for further treatment, heart attack, death, and other risks were discussed. I noted a good likelihood this will help address the problem. Possibility that this will not correct all abdominal symptoms was explained. Goals of post-operative recovery were discussed as well. We will work to minimize complications. An educational handout further explaining the pathology and treatment options was given as well. Questions were answered. The patient expresses understanding & wishes to proceed with surgery.

## 2020-09-22 NOTE — Anesthesia Postprocedure Evaluation (Signed)
Anesthesia Post Note  Patient: [name]  Procedure(s) Performed: LAPAROSCOPIC CHOLECYSTECTOMY (N/A Abdomen)     Patient location during evaluation: PACU Anesthesia Type: General Level of consciousness: awake Pain management: pain level controlled Vital Signs Assessment: post-procedure vital signs reviewed and stable Respiratory status: spontaneous breathing and respiratory function stable Cardiovascular status: stable Postop Assessment: no apparent nausea or vomiting Anesthetic complications: no   No complications documented.  Last Vitals:  Vitals:   09/22/20 0938 09/22/20 1001  BP: 94/70 95/71  Pulse: (!) 55 (!) 55  Resp: 13 16  Temp:  36.7 C  SpO2: 97% 98%    Last Pain:  Vitals:   09/22/20 1001  TempSrc:   PainSc: 4                  Candra R Colleen Kotlarz

## 2020-09-22 NOTE — Discharge Instructions (Signed)
[facility] ______[facility] °LAPAROSCOPIC SURGERY: POST OP INSTRUCTIONS °Always review your discharge instruction sheet given to you by the facility where your surgery was performed. °IF YOU HAVE DISABILITY OR FAMILY LEAVE FORMS, YOU MUST BRING THEM TO THE OFFICE FOR PROCESSING.   °DO NOT GIVE THEM TO YOUR DOCTOR. ° °1. A prescription for pain medication may be given to you upon discharge.  Take your pain medication as prescribed, if needed.  If narcotic pain medicine is not needed, then you may take acetaminophen (Tylenol) or ibuprofen (Advil) as needed. °2. Take your usually prescribed medications unless otherwise directed. °3. If you need a refill on your pain medication, please contact your pharmacy.  They will contact our office to request authorization. Prescriptions will not be filled after 5pm or on week-ends. °4. You should follow a light diet the first few days after arrival home, such as soup and crackers, etc.  Be sure to include lots of fluids daily. °5. Most patients will experience some swelling and bruising in the area of the incisions.  Ice packs will help.  Swelling and bruising can take several days to resolve.  °6. It is common to experience some constipation if taking pain medication after surgery.  Increasing fluid intake and taking a stool softener (such as Colace) will usually help or prevent this problem from occurring.  A mild laxative (Milk of Magnesia or Miralax) should be taken according to package instructions if there are no bowel movements after 48 hours. °7. Unless discharge instructions indicate otherwise, you may remove your bandages 24-48 hours after surgery, and you may shower at that time.  You may have steri-strips (small skin tapes) in place directly over the incision.  These strips should be left on the skin for 7-10 days.  If your surgeon used skin glue on the incision, you may shower in 24 hours.  The glue will flake off over the next 2-3 weeks.  Any sutures or  staples will be removed at the office during your follow-up visit. °8. ACTIVITIES:  You may resume regular (light) daily activities beginning the next day--such as daily self-care, walking, climbing stairs--gradually increasing activities as tolerated.  You may have sexual intercourse when it is comfortable.  Refrain from any heavy lifting or straining until approved by your doctor. °a. You may drive when you are no longer taking prescription pain medication, you can comfortably wear a seatbelt, and you can safely maneuver your car and apply brakes. °b. RETURN TO WORK:  __________________________________________________________ °9. You should see your doctor in the office for a follow-up appointment approximately 2-3 weeks after your surgery.  Make sure that you call for this appointment within a day or two after you arrive home to insure a convenient appointment time. °10. OTHER INSTRUCTIONS: __________________________________________________________________________________________________________________________ __________________________________________________________________________________________________________________________ °WHEN TO CALL YOUR DOCTOR: °1. Fever over 101.0 °2. Inability to urinate °3. Continued bleeding from incision. °4. Increased pain, redness, or drainage from the incision. °5. Increasing abdominal pain ° °The clinic staff is available to answer your questions during regular business hours.  Please don’t hesitate to call and ask to speak to one of the nurses for clinical concerns.  If you have a medical emergency, go to the nearest emergency room or call 911.  A surgeon from [facility] is always on call at the hospital. °[address], [address], [place], [place]  [address] ? [address] [address], [place], [place]   [address] °[phone] ? [phone] ? FAX [phone] °Web site:   [email or web address] °

## 2020-09-22 NOTE — Progress Notes (Signed)
Patient ready for discharge. Waiting for ride. Ride says they will be here between 10:30am-11am.   Alazia Crocket, Geroge Baseman, RN

## 2020-09-22 NOTE — Anesthesia Procedure Notes (Signed)
Procedure Name: Intubation Date/Time: 09/22/2020 7:38 AM Performed by: Rande Brunt, CRNA Pre-anesthesia Checklist: Patient identified, Emergency Drugs available, Suction available and Patient being monitored Patient Re-evaluated:Patient Re-evaluated prior to induction Oxygen Delivery Method: Circle System Utilized Preoxygenation: Pre-oxygenation with 100% oxygen Induction Type: IV induction Ventilation: Mask ventilation without difficulty Laryngoscope Size: Mac and 3 Grade View: Grade I Tube type: Oral Number of attempts: 1 Airway Equipment and Method: Stylet Placement Confirmation: ETT inserted through vocal cords under direct vision,  positive ETCO2 and breath sounds checked- equal and bilateral Secured at: 22 cm Tube secured with: Tape Dental Injury: Teeth and Oropharynx as per pre-operative assessment

## 2020-09-22 NOTE — Op Note (Signed)
Laparoscopic Cholecystectomy Procedure Note  Indications: This patient presents with symptomatic gallbladder disease and will undergo laparoscopic cholecystectomy.The procedure has been discussed with the patient. Operative and non operative treatments have been discussed. Risks of surgery include bleeding, infection,  Common bile duct injury,  Injury to the stomach,liver, colon,small intestine, abdominal wall,  Diaphragm,  Major blood vessels,  And the need for an open procedure.  Other risks include worsening of medical problems, death,  DVT and pulmonary embolism, and cardiovascular events.   Medical options have also been discussed. The patient has been informed of long term expectations of surgery and non surgical options,  The patient agrees to proceed.    Pre-operative Diagnosis: Calculus of gallbladder without mention of cholecystitis or obstruction  Post-operative Diagnosis: Same  Surgeon: Dortha Schwalbe  MD   Assistants: OR staff  Anesthesia: General endotracheal anesthesia and Local anesthesia 0.25.% bupivacaine, with epinephrine  ASA Class: 1  Procedure Details  The patient was seen again in the Holding Room. The risks, benefits, complications, treatment options, and expected outcomes were discussed with the patient. The possibilities of reaction to medication, pulmonary aspiration, perforation of viscus, bleeding, recurrent infection, finding a normal gallbladder, the need for additional procedures, failure to diagnose a condition, the possible need to convert to an open procedure, and creating a complication requiring transfusion or operation were discussed with the patient. The patient and/or family concurred with the proposed plan, giving informed consent. The site of surgery properly noted/marked. The patient was taken to Operating Room, identified as Tracy Hines and the procedure verified as Laparoscopic Cholecystectomy with Intraoperative Cholangiograms. A Time Out was  held and the above information confirmed.  Prior to the induction of general anesthesia, antibiotic prophylaxis was administered. General endotracheal anesthesia was then administered and tolerated well. After the induction, the abdomen was prepped in the usual sterile fashion. The patient was positioned in the supine position with the left arm comfortably tucked, along with some reverse Trendelenburg.  Local anesthetic agent was injected into the skin near the umbilicus and an incision made. The midline fascia was incised and the Hasson technique was used to introduce a 12 mm port under direct vision. It was secured with a figure of eight Vicryl suture placed in the usual fashion. Pneumoperitoneum was then created with CO2 and tolerated well without any adverse changes in the patient's vital signs. Additional trocars were introduced under direct vision with an 11 mm trocar in the epigastrium and 2 5 mm trocars in the right upper quadrant. All skin incisions were infiltrated with a local anesthetic agent before making the incision and placing the trocars.   The gallbladder was identified, the fundus grasped and retracted cephalad. Adhesions were lysed bluntly and with the electrocautery where indicated, taking care not to injure any adjacent organs or viscus. The infundibulum was grasped and retracted laterally, exposing the peritoneum overlying the triangle of Calot. This was then divided and exposed in a blunt fashion. The cystic duct was clearly identified and bluntly dissected circumferentially. The junctions of the gallbladder, cystic duct and common bile duct were clearly identified prior to the division of any linear structure.   Once anatomy was identified and the critical view obtained, the cystic duct was examined and was noted to be extremely small.  Cholangiogram would be very difficult with the catheter size and accommodation of a small cystic duct.  The critical view was obtained.  The junction  of the cystic duct and common bile duct were visualized.  Given the small size of the cystic duct, normal LFTs and no significant ductal dilatation noted, cholangiogram was not performed.  The cystic duct was then  ligated with surgical clips  on the patient side and  clipped on the gallbladder side and divided. The cystic artery was identified, dissected free, ligated with clips and divided as well. Posterior cystic artery clipped and divided.  The gallbladder was dissected from the liver bed in retrograde fashion with the electrocautery. The gallbladder was removed. The liver bed was irrigated and inspected. Hemostasis was achieved with the electrocautery. Copious irrigation was utilized and was repeatedly aspirated until clear all particulate matter. Hemostasis was achieved with cautery and Arista with no signs  Of bleeding or bile leakage.  Pneumoperitoneum was completely reduced after viewing removal of the trocars under direct vision. The wound was thoroughly irrigated and the fascia was then closed with a figure of eight suture; the skin was then closed with 4-0 Monocryl and a sterile dressing of Dermabond was applied.  Instrument, sponge, and needle counts were correct at closure and at the conclusion of the case.   Findings:  Cholelithiasis  Estimated Blood Loss: less than 50 mL         Drains: None         Total IV Fluids: Per anesthesia record         Specimens: Gallbladder           Complications: None; patient tolerated the procedure well.         Disposition: PACU - hemodynamically stable.         Condition: stable

## 2020-09-22 NOTE — Interval H&P Note (Signed)
History and Physical Interval Note:  09/22/2020 7:18 AM  Tracy Hines  has presented today for surgery, with the diagnosis of GALLSTONES.  The various methods of treatment have been discussed with the patient and family. After consideration of risks, benefits and other options for treatment, the patient has consented to  Procedure(s): LAPAROSCOPIC CHOLECYSTECTOMY WITH POSSIBLE INTRAOPERATIVE CHOLANGIOGRAM (N/A) as a surgical intervention.  The patient's history has been reviewed, patient examined, no change in status, stable for surgery.  I have reviewed the patient's chart and labs.  Questions were answered to the patient's satisfaction.  The procedure has been discussed with the patient. Operative and non operative treatments have been discussed. Risks of surgery include bleeding, infection,  Common bile duct injury,  Injury to the stomach,liver, colon,small intestine, abdominal wall,  Diaphragm,  Major blood vessels,  And the need for an open procedure.  Other risks include worsening of medical problems, death,  DVT and pulmonary embolism, and cardiovascular events.   Medical options have also been discussed. The patient has been informed of long term expectations of surgery and non surgical options,  The patient agrees to proceed.      Armistead Sult A Abi Shoults

## 2020-09-23 ENCOUNTER — Encounter (HOSPITAL_COMMUNITY): Payer: Self-pay | Admitting: Surgery

## 2020-09-23 LAB — SURGICAL PATHOLOGY

## 2021-01-07 ENCOUNTER — Ambulatory Visit: Payer: Medicaid Other | Admitting: Obstetrics and Gynecology

## 2021-08-30 IMAGING — US US MFM OB FOLLOW-UP
1 series · 14 of 28 positions shown · non-contrast
Comparison: none

[Series 1: us mfm ob follow-up · 14 of 80 slices shown]
[im 3/80]
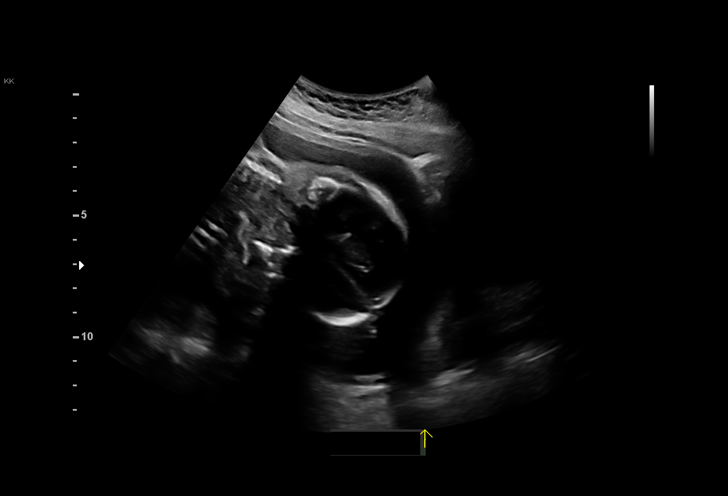
[im 9/80]
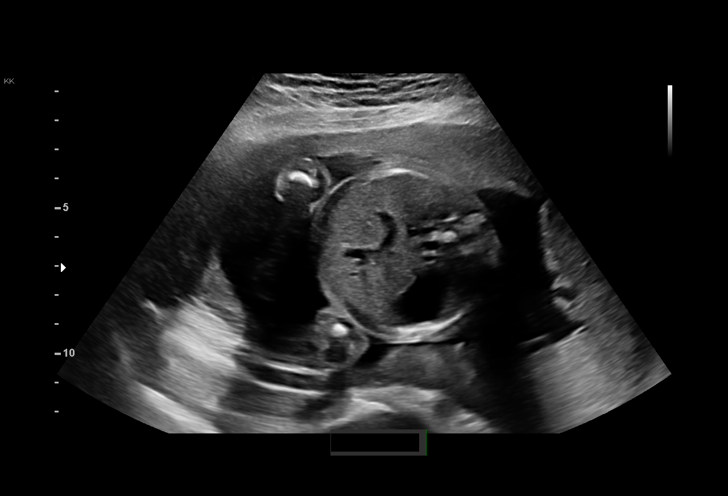
[im 15/80]
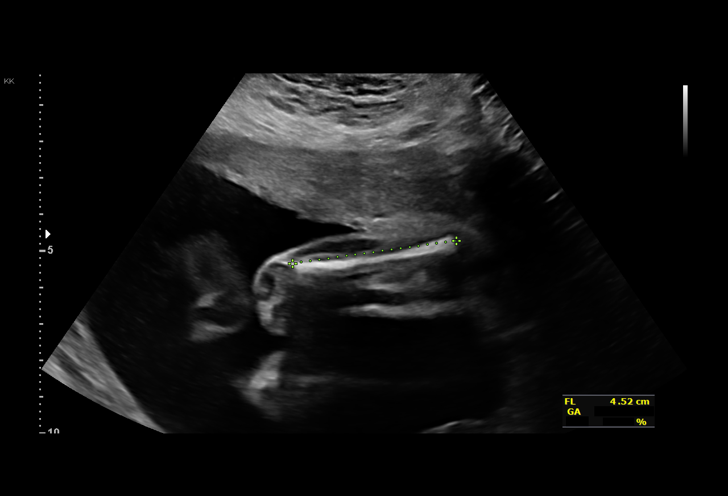
[im 21/80]
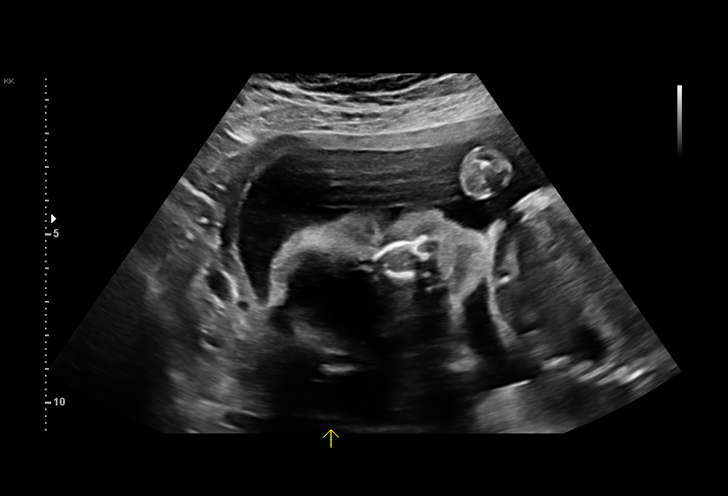
[im 27/80]
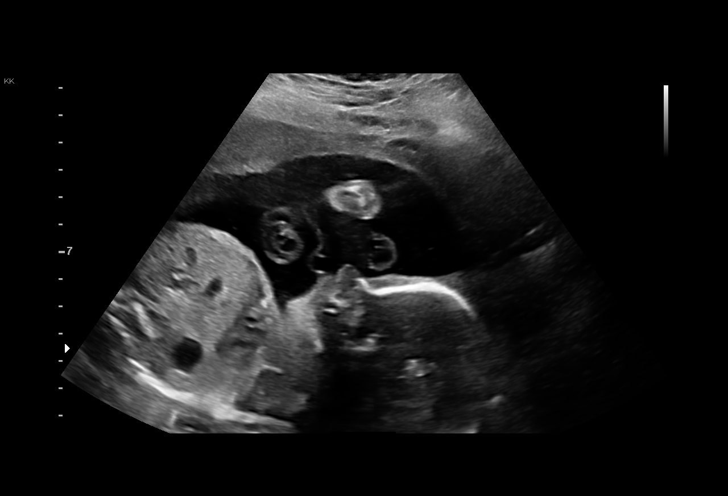
[im 33/80]
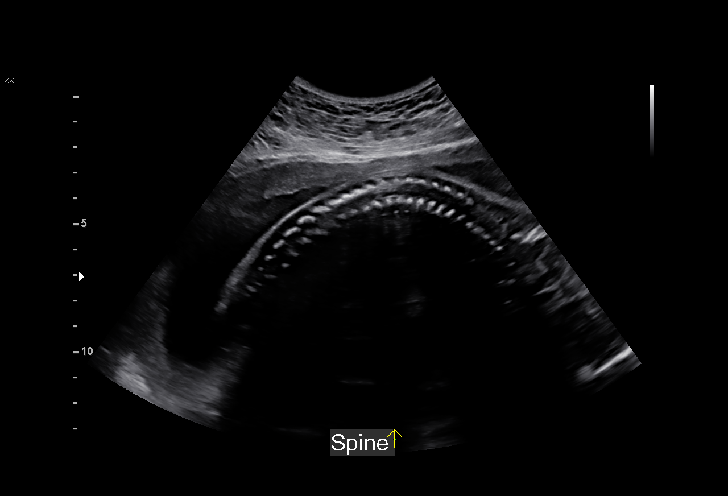
[im 39/80]
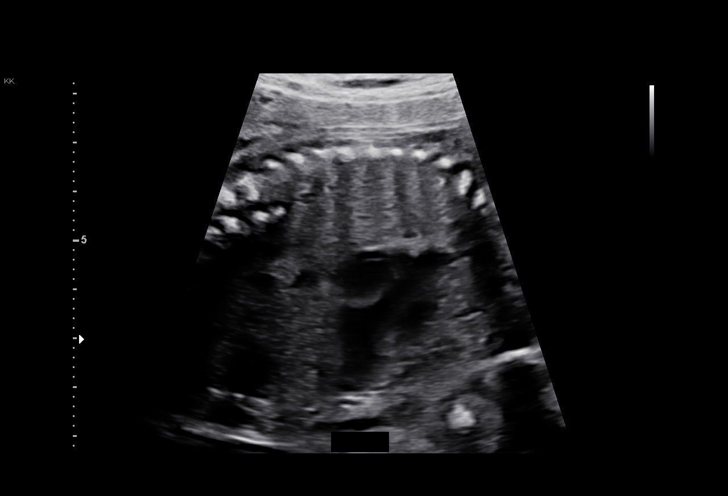
[im 44/80]
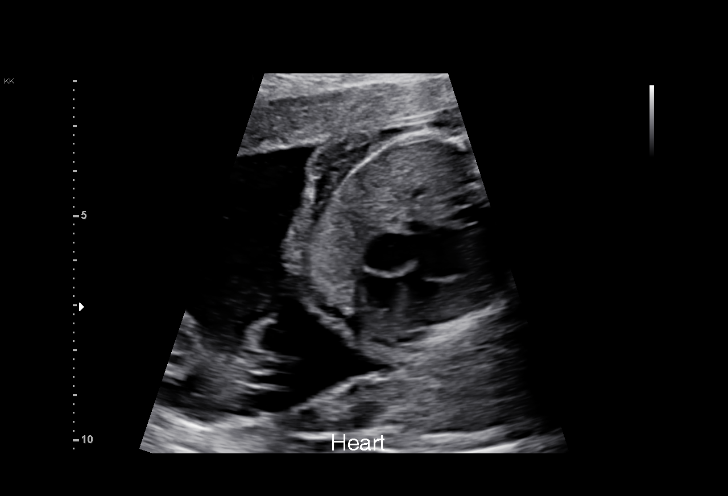
[im 50/80]
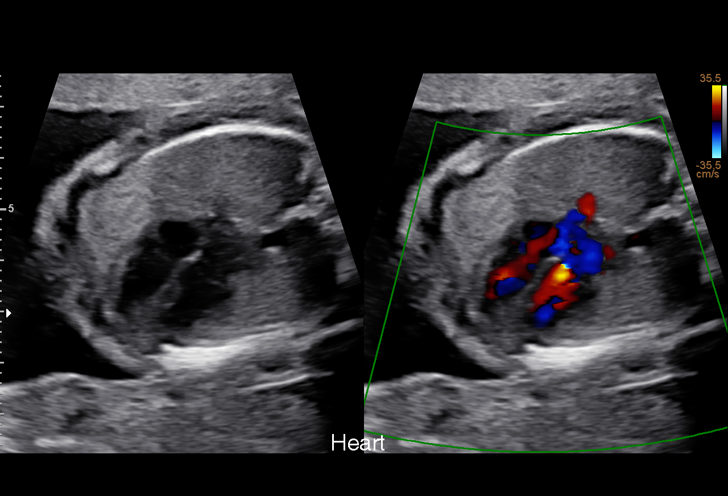
[im 56/80]
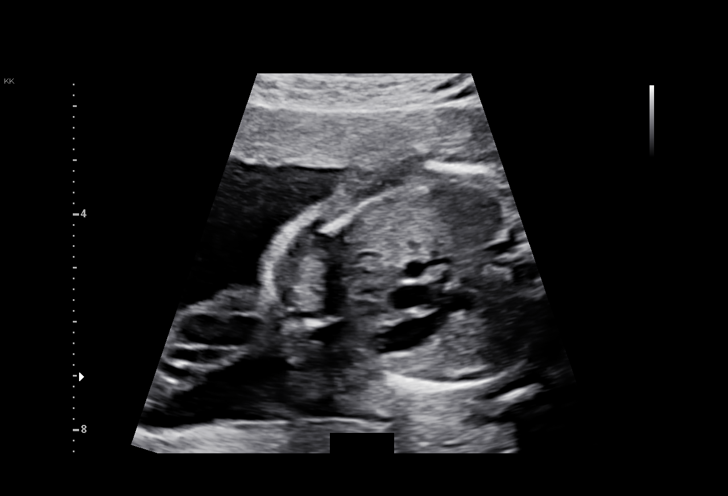
[im 62/80]
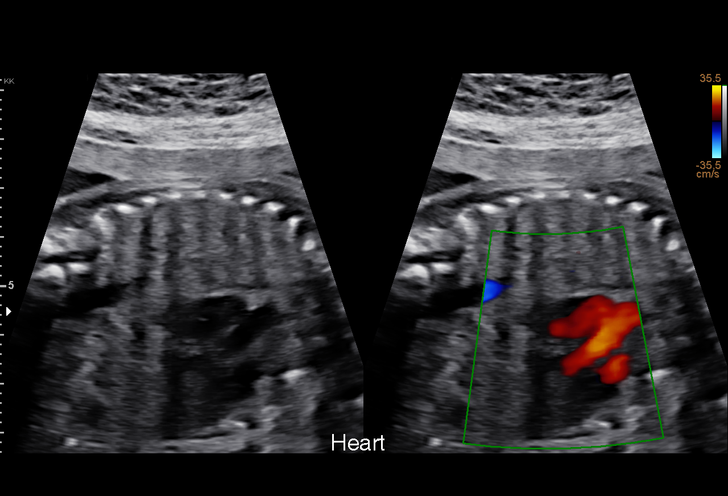
[im 68/80]
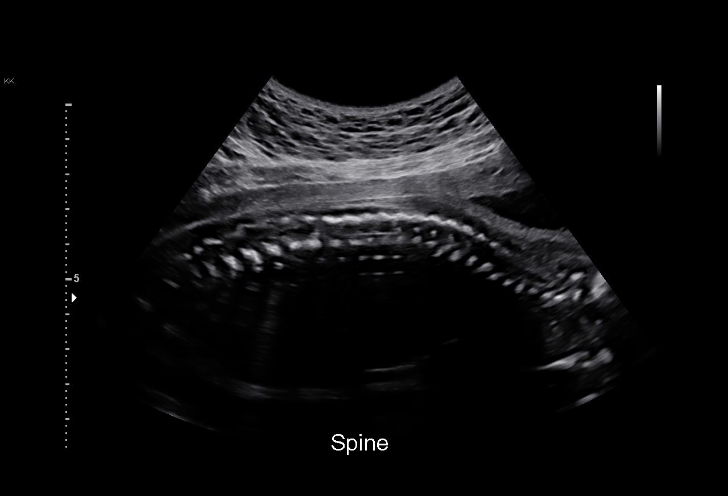
[im 74/80]
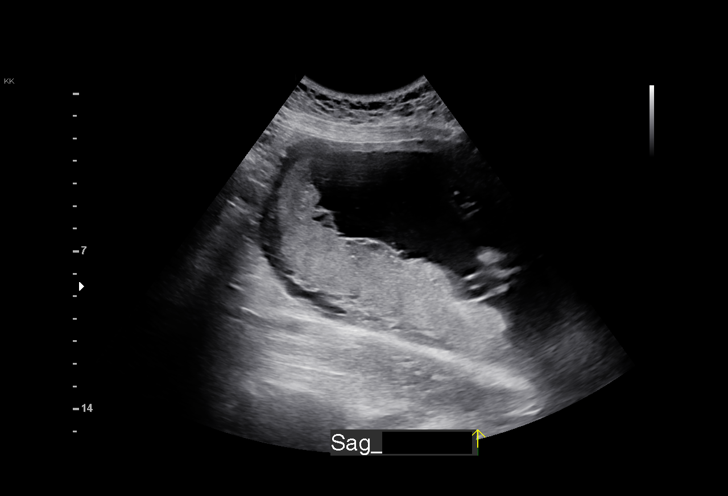
[im 80/80]
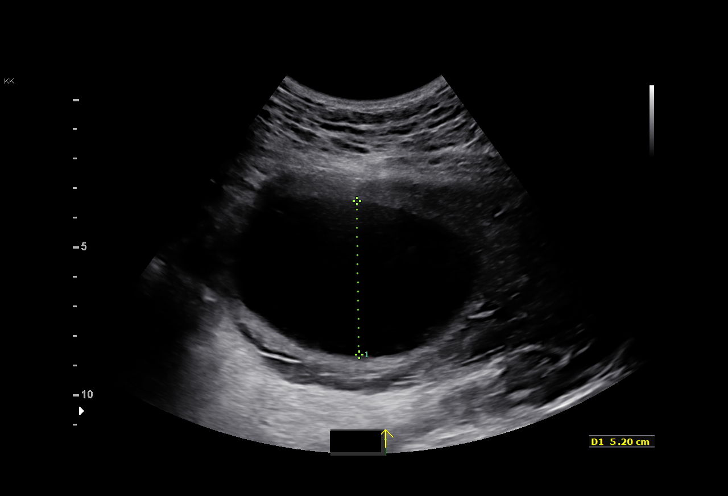

[14 of 28 positions shown; findings below may reference images not displayed]

----------------------------------------------------------------------

 ----------------------------------------------------------------------
Indications

  Antenatal follow-up for nonvisualized fetal
  anatomy
  24 weeks gestation of pregnancy
  Negative Horizon
 ----------------------------------------------------------------------
Fetal Evaluation

 Num Of Fetuses:         1
 Fetal Heart Rate(bpm):  151
 Cardiac Activity:       Observed
 Presentation:           Cephalic
 Placenta:               Posterior
 P. Cord Insertion:      Previously Visualized

 Amniotic Fluid
 AFI FV:      Within normal limits

                             Largest Pocket(cm)

Biometry

 BPD:      59.1  mm     G. Age:  24w 1d         49  %    CI:        74.58   %    70 - 86
                                                         FL/HC:      20.4   %    18.7 -
 HC:      217.2  mm     G. Age:  23w 5d         24  %    HC/AC:      1.07        1.05 -
 AC:      203.9  mm     G. Age:  25w 0d         72  %    FL/BPD:     75.0   %    71 - 87
 FL:       44.3  mm     G. Age:  24w 4d         57  %    FL/AC:      21.7   %    20 - 24

 Est. FW:     722  gm      1 lb 9 oz     73  %
OB History

 Gravidity:    2         Term:   0        Prem:   0        SAB:   1
 TOP:          0       Ectopic:  0        Living: 0
Gestational Age

 LMP:           24w 0d        Date:  12/28/18                 EDD:   10/04/19
 U/S Today:     24w 3d                                        EDD:   10/01/19
 Best:          24w 0d     Det. By:  LMP  (12/28/18)          EDD:   10/04/19
Anatomy

 Cranium:               Appears normal         Aortic Arch:            Appears normal
 Cavum:                 Previously seen        Ductal Arch:            Not well visualized
 Ventricles:            Appears normal         Diaphragm:              Appears normal
 Choroid Plexus:        Previously seen        Stomach:                Appears normal, left
                                                                       sided
 Cerebellum:            Appears normal         Abdomen:                Appears normal
 Posterior Fossa:       Appears normal         Abdominal Wall:         Appears nml (cord
                                                                       insert, abd wall)
 Nuchal Fold:           Previously seen        Cord Vessels:           Previously seen
 Face:                  Orbits and profile     Kidneys:                Appear normal
                        previously seen
 Lips:                  Appears normal         Bladder:                Appears normal
 Thoracic:              Appears normal         Spine:                  Previously seen
 Heart:                 Appears normal         Upper Extremities:      Previously seen
                        (4CH, axis, and
                        situs)
 RVOT:                  Appears normal         Lower Extremities:      Previously seen
 LVOT:                  Appears normal

 Other:  Parents do not wish to know sex of fetus. Technically difficult due to
         fetal position.
Cervix Uterus Adnexa

 Cervix
 Not visualized (advanced GA >57wks)
Impression

 Patient returned for completion of fetal anatomy.
 Fetal growth is appropriate for gestational age. Amniotic fluid
 is normal and good fetal activity is seen. Fetal anatomical
 survey was completed and appears normal. I spoke to her
 mother on phone and reassured her.
Recommendations

 Follow-up scans as clinically indicated.
                 Eva, Kiya

## 2022-03-08 ENCOUNTER — Ambulatory Visit (INDEPENDENT_AMBULATORY_CARE_PROVIDER_SITE_OTHER): Payer: Medicaid Other | Admitting: Obstetrics and Gynecology

## 2022-03-08 ENCOUNTER — Encounter: Payer: Self-pay | Admitting: Obstetrics and Gynecology

## 2022-03-08 ENCOUNTER — Other Ambulatory Visit (HOSPITAL_COMMUNITY)
Admission: RE | Admit: 2022-03-08 | Discharge: 2022-03-08 | Disposition: A | Discharge status: Home / Self Care | Payer: Medicaid Other | Source: Ambulatory Visit | Attending: Obstetrics | Admitting: Obstetrics

## 2022-03-08 VITALS — BP 103/72 | HR 64 | Ht 61.0 in | Wt 138.0 lb

## 2022-03-08 DIAGNOSIS — Z3046 Encounter for surveillance of implantable subdermal contraceptive: Secondary | ICD-10-CM

## 2022-03-08 DIAGNOSIS — Z01419 Encounter for gynecological examination (general) (routine) without abnormal findings: Secondary | ICD-10-CM

## 2022-03-08 DIAGNOSIS — N920 Excessive and frequent menstruation with regular cycle: Secondary | ICD-10-CM

## 2022-03-08 NOTE — Progress Notes (Signed)
Subjective:     Tracy Hines is a 21 y.o. female P1 who is here for a comprehensive physical exam. The patient reports irregular vaginal bleeding with Nexplanon and is requesting its removal. She is without any other complaints. She is sexually active and plans to use condoms following nexplanon removal. She denies pelvic pain or abnormal discharge  Past Medical History:  Diagnosis Date   Medical history non-contributory    Past Surgical History:  Procedure Laterality Date   CHOLECYSTECTOMY N/A 09/22/2020   Procedure: LAPAROSCOPIC CHOLECYSTECTOMY;  Surgeon: Erroll Luna, MD;  Location: [place] [facility];  Service: General;  Laterality: N/A;   LAPAROSCOPIC APPENDECTOMY N/A 01/26/2019   Procedure: APPENDECTOMY LAPAROSCOPIC;  Surgeon: Erroll Luna, MD;  Location: [place];  Service: General;  Laterality: N/A;   WISDOM TOOTH EXTRACTION     No family history on file.  Social History   Socioeconomic History   Marital status: Single    Spouse name: Not on file   Number of children: Not on file   Years of education: Not on file   Highest education level: High school graduate  Occupational History   Not on file  Tobacco Use   Smoking status: Never   Smokeless tobacco: Never  Vaping Use   Vaping Use: Some days   Substances: Nicotine, Flavoring  Substance and Sexual Activity   Alcohol use: Not Currently   Drug use: Not Currently    Types: Marijuana    Comment: 2-3 years ago    Sexual activity: Yes    Birth control/protection: Implant    Comment: Nexplanon Implant  Other Topics Concern   Not on file  Social History Narrative   Not on file   Social Determinants of Health   Financial Resource Strain: Low Risk  (03/11/2019)   Overall Financial Resource Strain (CARDIA)    Difficulty of Paying Living Expenses: Not hard at all  Food Insecurity: No Food Insecurity (03/11/2019)   Hunger Vital Sign    Worried About Running Out of Food in the Last Year: Never true    Ran Out of Food in the  Last Year: Never true  Transportation Needs: No Transportation Needs (03/11/2019)   PRAPARE - Hydrologist (Medical): No    Lack of Transportation (Non-Medical): No  Physical Activity: Inactive (03/11/2019)   Exercise Vital Sign    Days of Exercise per Week: 0 days    Minutes of Exercise per Session: 0 min  Stress: No Stress Concern Present (03/11/2019)   [place]    Feeling of Stress : Not at all  Social Connections: Moderately Isolated (03/11/2019)   Social Connection and Isolation Panel [NHANES]    Frequency of Communication with Friends and Family: More than three times a week    Frequency of Social Gatherings with Friends and Family: More than three times a week    Attends Religious Services: 1 to 4 times per year    Active Member of [employer] or Organizations: No    Attends Archivist Meetings: Never    Marital Status: Never married  Intimate Partner Violence: Not At Risk (03/11/2019)   Humiliation, Afraid, Rape, and Kick questionnaire    Fear of Current or Ex-Partner: No    Emotionally Abused: No    Physically Abused: No    Sexually Abused: No   Health Maintenance  Topic Date Due   HPV VACCINES (1 - 2-dose series) Never done   Hepatitis  C Screening  Never done   CHLAMYDIA SCREENING  09/11/2020   PAP-Cervical Cytology Screening  Never done   PAP SMEAR-Modifier  Never done   INFLUENZA VACCINE  12/21/2021   TETANUS/TDAP  07/03/2029   HIV Screening  Completed       Review of Systems Pertinent items noted in HPI and remainder of comprehensive ROS otherwise negative.   Objective:  Blood pressure 103/72, pulse 64, height 5\' 1"  (1.549 m), weight 138 lb (62.6 kg), unknown if currently breastfeeding.   GENERAL: Well-developed, well-nourished female in no acute distress.  HEENT: Normocephalic, atraumatic. Sclerae anicteric.  NECK: Supple. Normal thyroid.  LUNGS: Clear to  auscultation bilaterally.  HEART: Regular rate and rhythm. BREASTS: Symmetric in size. No palpable masses or lymphadenopathy, skin changes, or nipple drainage. ABDOMEN: Soft, nontender, nondistended. No organomegaly. PELVIC: Normal external female genitalia. Vagina is pink and rugated.  Normal discharge. Normal appearing cervix. Uterus is normal in size. No adnexal mass or tenderness. Chaperone present during the pelvic exam EXTREMITIES: No cyanosis, clubbing, or edema, 2+ distal pulses.     Assessment:    Healthy female exam.      Plan:    Pap smear with GC/Cl collected Patient will be contacted with abnormal results Patient is requesting Nexplanon removal- Nexplanon has been in place since 10/2019  Removal Patient given informed consent for removal of her Nexplanon, time out was performed.  Signed copy in the chart.  Appropriate time out taken. Implanon site identified.  Area prepped in usual sterile fashon. One cc of 1% lidocaine was used to anesthetize the area at the distal end of the implant. A small stab incision was made right beside the implant on the distal portion.  The Nexplanon rod was grasped using hemostats and removed without difficulty.  There was less than 3 cc blood loss. There were no complications.  A small amount of antibiotic ointment and steri-strips were applied over the small incision.  A pressure bandage was applied to reduce any bruising.  The patient tolerated the procedure well and was given post procedure instructions.  See After Visit Summary for Counseling Recommendations

## 2022-03-10 LAB — CYTOLOGY - PAP
Chlamydia: NEGATIVE
Comment: NEGATIVE
Comment: NORMAL
Neisseria Gonorrhea: NEGATIVE

## 2022-03-15 ENCOUNTER — Telehealth: Payer: Self-pay | Admitting: Emergency Medicine

## 2022-03-15 ENCOUNTER — Encounter: Payer: Self-pay | Admitting: Emergency Medicine

## 2022-03-15 NOTE — Telephone Encounter (Signed)
TC to patient to discuss results, info about HPV vaccine sent to pt via Mychart

## 2022-07-27 ENCOUNTER — Ambulatory Visit (INDEPENDENT_AMBULATORY_CARE_PROVIDER_SITE_OTHER): Payer: Medicaid Other

## 2022-07-27 DIAGNOSIS — Z3201 Encounter for pregnancy test, result positive: Secondary | ICD-10-CM

## 2022-07-27 DIAGNOSIS — Z3200 Encounter for pregnancy test, result unknown: Secondary | ICD-10-CM

## 2022-07-27 LAB — POCT URINE PREGNANCY: Preg Test, Ur: POSITIVE — AB

## 2022-07-27 MED ORDER — PRENATE MINI 18-0.6-0.4-350 MG PO CAPS: 1.0000 | ORAL_CAPSULE | Freq: Every day | ORAL | 11 refills | Status: AC

## 2022-07-27 NOTE — Progress Notes (Signed)
..  Tracy Hines presents today for UPT. She has no unusual complaints. LMP:06/10/22     OBJECTIVE: Appears well, in no apparent distress.  OB History     Gravida  3   Para  1   Term  1   Preterm      AB  1   Living  1      SAB      IAB      Ectopic      Multiple      Live Births  1          Home UPT Result:Positive  In-Office UPT result:Positive I have reviewed the patient's medical, obstetrical, social, and family histories, and medications.   ASSESSMENT: Positive pregnancy test  PLAN Prenatal care to be completed at: [facility] Pt plans to move to [place], possibly this month Sent PNV to pharmacy, provided safe med list

## 2022-08-23 IMAGING — US US ABDOMEN LIMITED
1 series · 14 of 25 positions shown · non-contrast
Comparison: MRI abdomen 01/26/2019

CLINICAL DATA: Elevated liver function tests, abdominal pain since
2 a.m.

EXAM:
ULTRASOUND ABDOMEN LIMITED RIGHT UPPER QUADRANT

[Series 1: us abdomen limited ruq (liver/gb) · 14 of 47 slices shown]
[im 1/47]
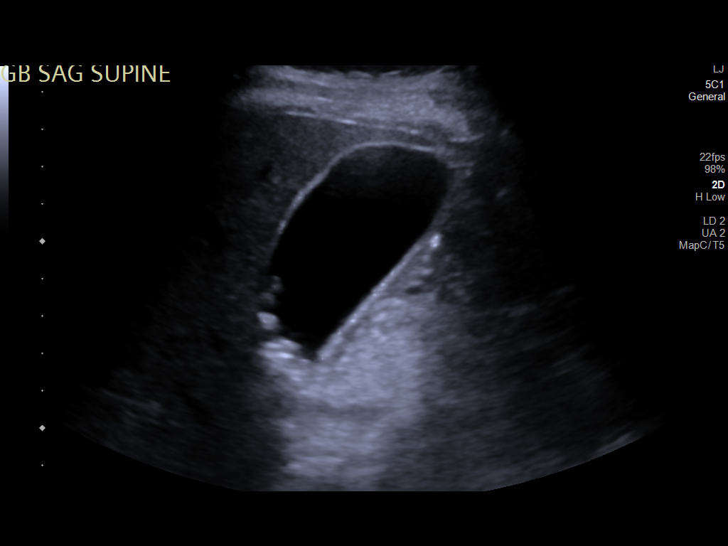
[im 4/47]
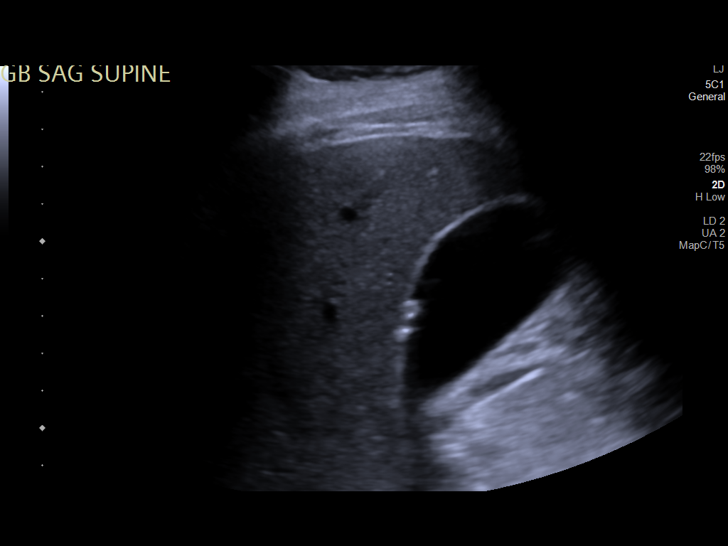
[im 8/47]
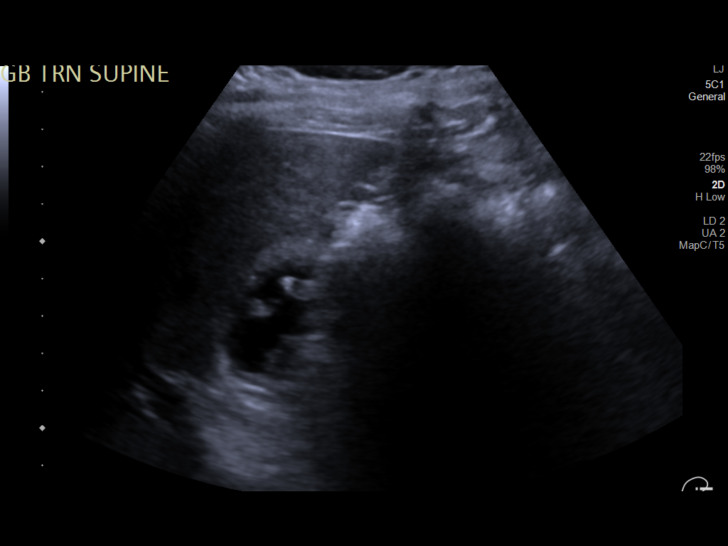
[im 12/47]
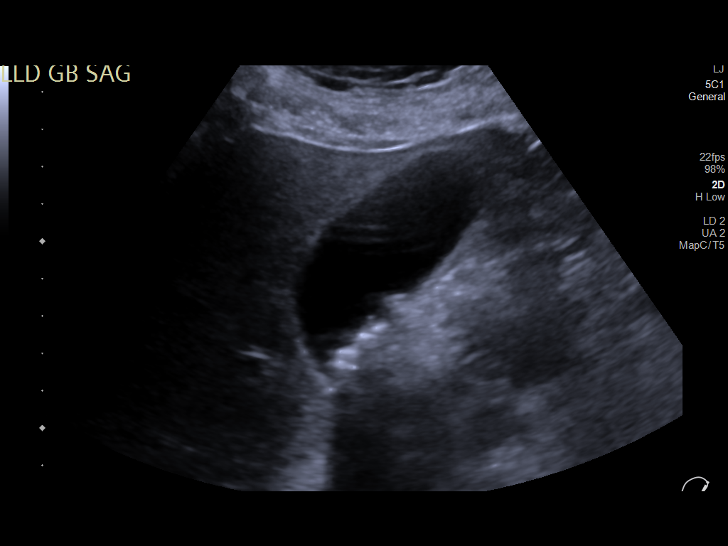
[im 16/47]
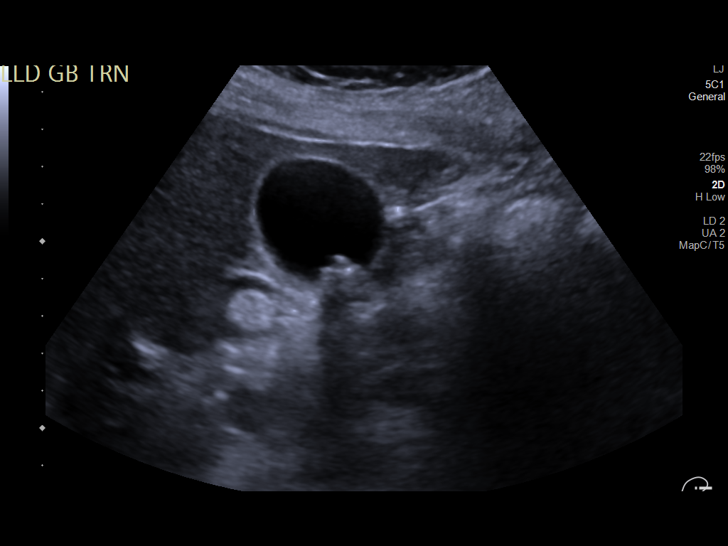
[im 18/47]
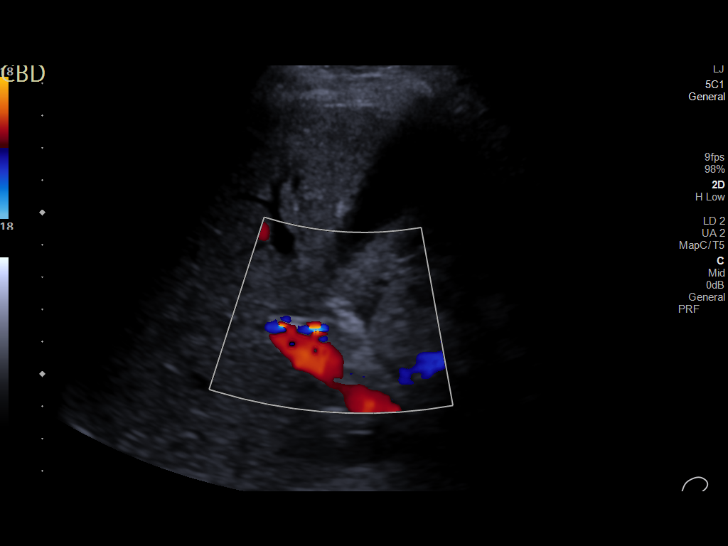
[im 22/47]
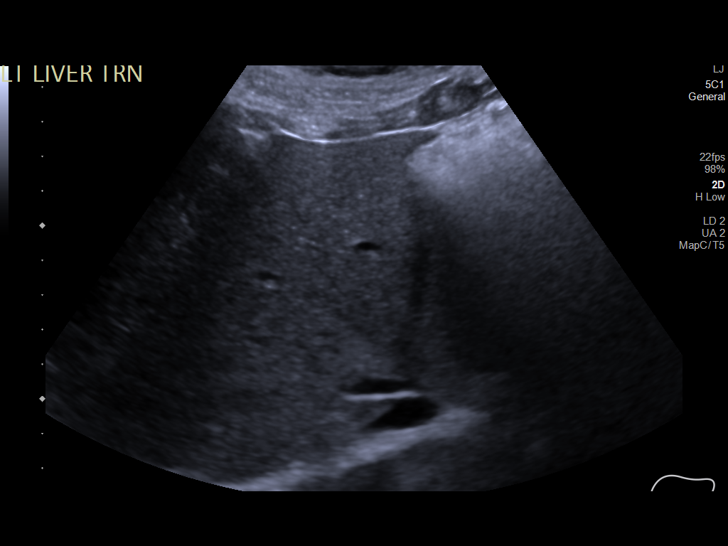
[im 25/47]
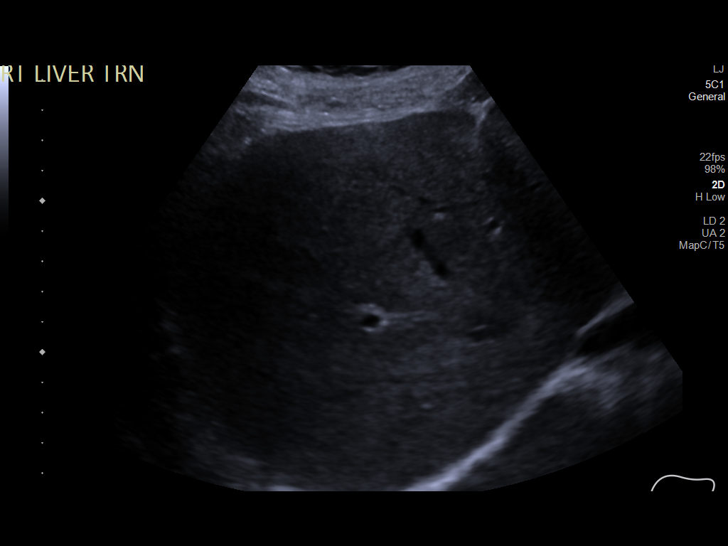
[im 29/47]
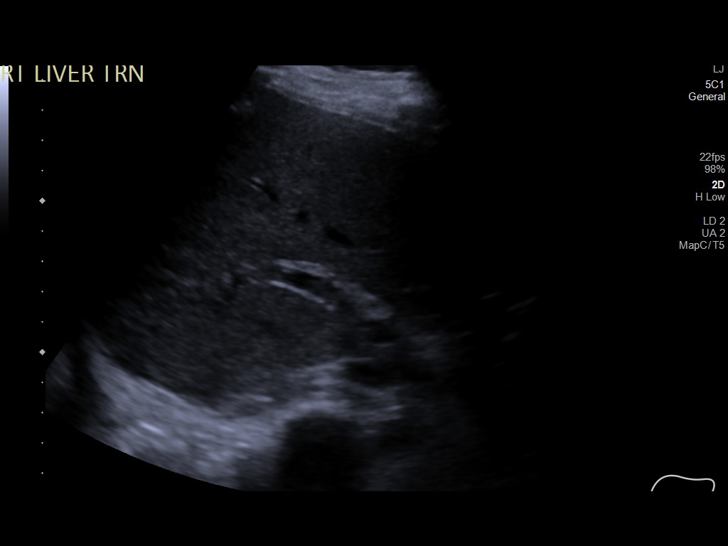
[im 31/47]
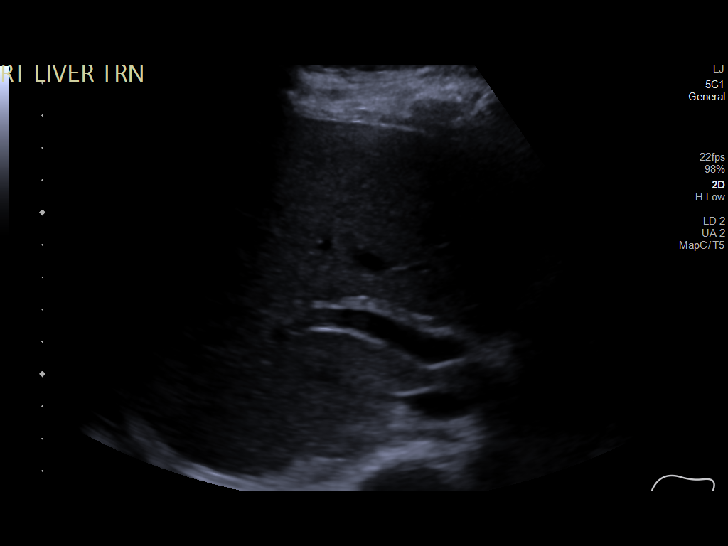
[im 35/47]
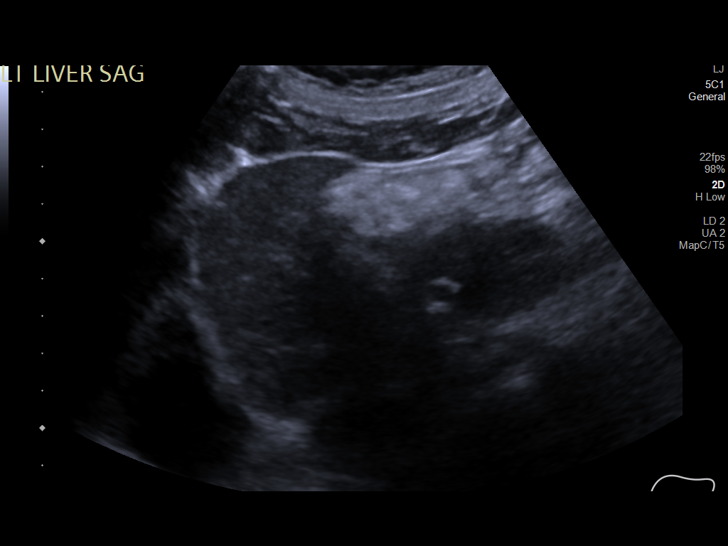
[im 39/47]
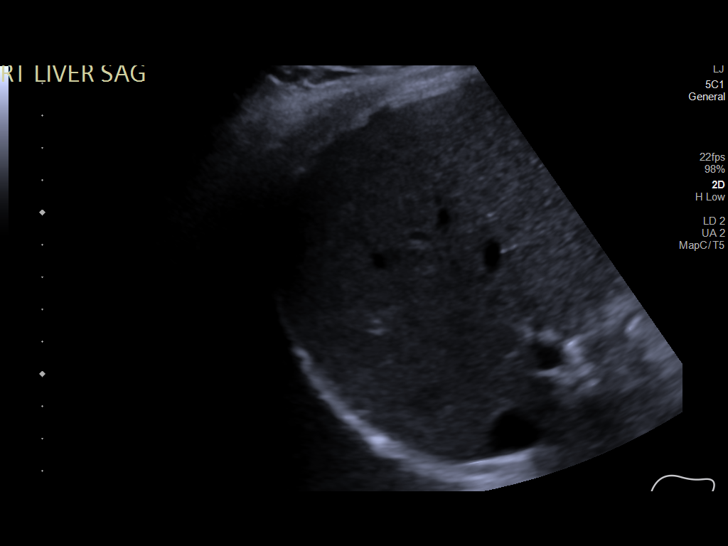
[im 43/47]
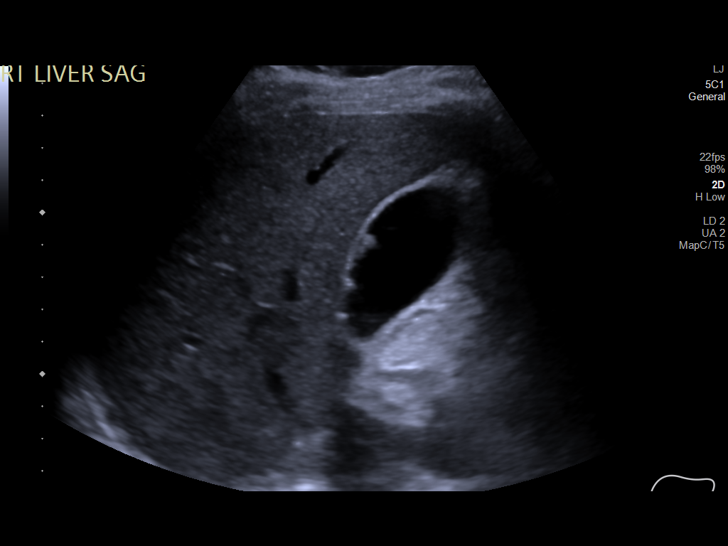
[im 47/47]
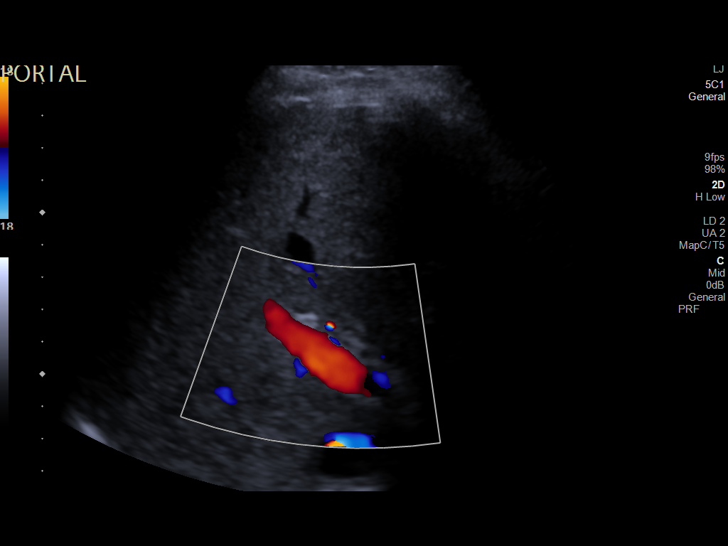

[14 of 25 positions shown; findings below may reference images not displayed]

FINDINGS: Gallbladder:

Calcified gallstones within the gallbladder lumen measuring up to
0.5 cm. No pericholecystic fluid or gallbladder wall thickening
visualized. No sonographic Murphy sign noted by sonographer.

Common bile duct:

Diameter: 3 mm

Liver:

No focal lesion identified. Within normal limits in parenchymal
echogenicity. Portal vein is patent on color Doppler imaging with
normal direction of blood flow towards the liver.

Other: Visualized portions of the right kidney demonstrates no
hydronephrosis.
IMPRESSION: Cholelithiasis with no acute cholecystitis or choledocholithiasis.

## 2022-09-15 ENCOUNTER — Encounter: Payer: Self-pay | Admitting: Student
